# Patient Record
Sex: Male | Born: 1980 | Race: Black or African American | Hispanic: No | Marital: Married | Smoking: Current every day smoker
Health system: Southern US, Community
[De-identification: ages and names within clinical notes are randomized; demographics above are authoritative.]

## PROBLEM LIST (undated history)

## (undated) DIAGNOSIS — E785 Hyperlipidemia, unspecified: Secondary | ICD-10-CM

## (undated) DIAGNOSIS — I10 Essential (primary) hypertension: Secondary | ICD-10-CM

## (undated) HISTORY — DX: Hyperlipidemia, unspecified: E78.5

## (undated) HISTORY — DX: Essential (primary) hypertension: I10

## (undated) HISTORY — PX: FINGER NAIL SURGERY: SHX717

---

## 2018-01-04 ENCOUNTER — Encounter (INDEPENDENT_AMBULATORY_CARE_PROVIDER_SITE_OTHER): Payer: Self-pay | Admitting: Podiatry

## 2018-01-04 ENCOUNTER — Ambulatory Visit: Payer: Self-pay

## 2018-01-04 DIAGNOSIS — M7751 Other enthesopathy of right foot: Secondary | ICD-10-CM

## 2018-01-04 DIAGNOSIS — M778 Other enthesopathies, not elsewhere classified: Secondary | ICD-10-CM

## 2018-01-04 DIAGNOSIS — M779 Enthesopathy, unspecified: Principal | ICD-10-CM

## 2018-01-04 NOTE — Progress Notes (Signed)
This encounter was created in error - please disregard.

## 2020-04-16 ENCOUNTER — Ambulatory Visit (INDEPENDENT_AMBULATORY_CARE_PROVIDER_SITE_OTHER): Payer: Self-pay | Admitting: Urology

## 2020-04-16 ENCOUNTER — Encounter: Payer: Self-pay | Admitting: Urology

## 2020-04-16 ENCOUNTER — Other Ambulatory Visit: Payer: Self-pay

## 2020-04-16 VITALS — BP 145/98 | HR 89 | Ht 67.0 in | Wt 264.0 lb

## 2020-04-16 DIAGNOSIS — Z202 Contact with and (suspected) exposure to infections with a predominantly sexual mode of transmission: Secondary | ICD-10-CM

## 2020-04-16 DIAGNOSIS — R3912 Poor urinary stream: Secondary | ICD-10-CM

## 2020-04-16 DIAGNOSIS — R3121 Asymptomatic microscopic hematuria: Secondary | ICD-10-CM

## 2020-04-16 DIAGNOSIS — R300 Dysuria: Secondary | ICD-10-CM

## 2020-04-16 DIAGNOSIS — N529 Male erectile dysfunction, unspecified: Secondary | ICD-10-CM

## 2020-04-16 LAB — MICROSCOPIC EXAMINATION: Bacteria, UA: NONE SEEN

## 2020-04-16 LAB — URINALYSIS, COMPLETE
Bilirubin, UA: NEGATIVE
Glucose, UA: NEGATIVE
Ketones, UA: NEGATIVE
Leukocytes,UA: NEGATIVE
Nitrite, UA: NEGATIVE
Specific Gravity, UA: 1.025 (ref 1.005–1.030)
Urobilinogen, Ur: 0.2 mg/dL (ref 0.2–1.0)
pH, UA: 5.5 (ref 5.0–7.5)

## 2020-04-16 LAB — BLADDER SCAN AMB NON-IMAGING

## 2020-04-16 MED ORDER — NYSTATIN-TRIAMCINOLONE 100000-0.1 UNIT/GM-% EX OINT: 1.0000 "application " | TOPICAL_OINTMENT | Freq: Two times a day (BID) | CUTANEOUS | 0 refills | Status: DC

## 2020-04-16 MED ORDER — TADALAFIL 5 MG PO TABS: 5.0000 mg | ORAL_TABLET | Freq: Every day | ORAL | 11 refills | Status: DC | PRN

## 2020-04-16 NOTE — Progress Notes (Signed)
04/16/20 2:01 PM   William Cabrera 09-27-1981 [id number]  CC: ED, urinary symptoms, history of STD  HPI: I saw William Cabrera in urology clinic today for the above issues.  He is a 39 year old African-American male with morbid obesity and BMI of 41, no family history of prostate cancer, and history of recent trichomonas STD who presents with erectile dysfunction, urinary frequency and occasional dysuria.  He reports he completed antibiotics for trichomonas last week.  He only is sexually active with one partner at this time, but reports he has had multiple partners in the past and has never used protection reliably, he is never been tested for other STDs.  His primary complaint is occasional dysuria and urinary frequency, and worsening erectile dysfunction.  Shim score today is 11, indicating moderate ED.  He has never tried any medications for this before.  He denies any history of UTI or urinary retention.  He denies any family history of prostate cancer.  He also reports some itching and irritation at the base of the penis over the last few weeks that has been bothersome.  He was noted to have microscopic hematuria on urinalysis today with 3-10 RBCs, urinalysis otherwise benign.  PMH: Past Medical History:  Diagnosis Date  . Hyperlipemia     Surgical History: Past Surgical History:  Procedure Laterality Date  . FINGER NAIL SURGERY     child    Family History: Family History  Problem Relation Age of Onset  . Colon cancer Maternal Grandfather   . Bladder Cancer Neg Hx   . Kidney cancer Neg Hx   . Prostate cancer Neg Hx     Social History:  reports that he has been smoking cigarettes. He has been smoking about 1.00 pack per day. He has quit using smokeless tobacco. He reports current alcohol use. No history on file for drug.  Physical Exam: BP (!) 145/98   Pulse 89   Ht 5\' 7"  (1.702 m)   Wt 264 lb (119.7 kg)   BMI 41.35 kg/m    Constitutional:  Alert and oriented, No acute  distress. Cardiovascular: No clubbing, cyanosis, or edema. Respiratory: Normal respiratory effort, no increased work of breathing. GI: Abdomen is soft, nontender, nondistended, no abdominal masses GU: Circumcised phallus, small but patent meatus, testicles 20 cc and descended bilaterally without masses.  Subtle induration of the skin at the ventral aspect of the base consistent with a possible fungal infection  Laboratory Data: Reviewed, see HPI  Pertinent Imaging: None to review  Assessment & Plan:   In summary, is a 39 year old male with recent trichomonas and mild persistent dysuria, subtle penoscrotal rash, erectile dysfunction, and microscopic hematuria on urinalysis today.  We had a long discussion about his history of STD, urinary symptoms, and ED today.  We discussed that AUA guidelines regarding both microscopic hematuria and work-up of erectile dysfunction.  We discussed weight loss and smoking cessation as first steps for his erectile dysfunction.  I also recommended testing for gonorrhea and chlamydia today with his history of persistent mild dysuria, microscopic hematuria, and history of extensive sexual partners without protection.  We also reviewed his low-grade microscopic hematuria today and the new AUA guidelines regarding risk categories of low, intermediate, and high risk.  He falls into the low risk category.  Additionally, he recently underwent treatment for an STD and this microscopic hematuria could be residual inflammation from this.  Finally, we reviewed Cialis as a medication for ED and the risks and benefits at  length.  We also discussed the role of testosterone and ED evaluation per the AUA guidelines.  -Weight loss and smoking cessation encouraged -Trial of Cialis for ED -STD testing today for gonorrhea and chlamydia, call with results -Trial of Mycolog cream for likely fungal rash at penoscrotal junction -RTC 1 month with repeat urinalysis to confirm clearance of  microscopic hematuria, consider testosterone at that visit if persistent ED symptoms despite Cialis  I spent 60 total minutes on the day of the encounter including pre-visit review of the medical record, face-to-face time with the patient, and post visit ordering of labs/imaging/tests.  Legrand Rams, MD 04/16/2020  [facility] [address] [place], [place] [address] [phone]

## 2020-04-16 NOTE — Patient Instructions (Signed)
Tadalafil tablets (Cialis) What is this medicine? TADALAFIL (tah DA la fil) is used to treat erection problems in men. It is also used for enlargement of the prostate gland in men, a condition called benign prostatic hyperplasia or BPH. This medicine improves urine flow and reduces BPH symptoms. This medicine can also treat both erection problems and BPH when they occur together. This medicine may be used for other purposes; ask your health care provider or pharmacist if you have questions. COMMON BRAND NAME(S): Adcirca, ALYQ, Cialis What should I tell my health care provider before I take this medicine? They need to know if you have any of these conditions:  bleeding disorders  eye or vision problems, including a rare inherited eye disease called retinitis pigmentosa  anatomical deformation of the penis, Peyronie's disease, or history of priapism (painful and prolonged erection)  heart disease, angina, a history of heart attack, irregular heart beats, or other heart problems  high or low blood pressure  history of blood diseases, like sickle cell anemia or leukemia  history of stomach bleeding  kidney disease  liver disease  stroke  an unusual or allergic reaction to tadalafil, other medicines, foods, dyes, or preservatives  pregnant or trying to get pregnant  breast-feeding How should I use this medicine? Take this medicine by mouth with a Arko of water. Follow the directions on the prescription label. You may take this medicine with or without meals. When this medicine is used for erection problems, your doctor may prescribe it to be taken once daily or as needed. If you are taking the medicine as needed, you may be able to have sexual activity 30 minutes after taking it and for up to 36 hours after taking it. Whether you are taking the medicine as needed or once daily, you should not take more than one dose per day. If you are taking this medicine for symptoms of benign  prostatic hyperplasia (BPH) or to treat both BPH and an erection problem, take the dose once daily at about the same time each day. Do not take your medicine more often than directed. Talk to your pediatrician regarding the use of this medicine in children. Special care may be needed. Overdosage: If you think you have taken too much of this medicine contact a poison control center or emergency room at once. NOTE: This medicine is only for you. Do not share this medicine with others. What if I miss a dose? If you are taking this medicine as needed for erection problems, this does not apply. If you miss a dose while taking this medicine once daily for an erection problem, benign prostatic hyperplasia, or both, take it as soon as you remember, but do not take more than one dose per day. What may interact with this medicine? Do not take this medicine with any of the following medications:  nitrates like amyl nitrite, isosorbide dinitrate, isosorbide mononitrate, nitroglycerin  other medicines for erectile dysfunction like avanafil, sildenafil, vardenafil  other tadalafil products (Adcirca)  riociguat This medicine may also interact with the following medications:  certain drugs for high blood pressure  certain drugs for the treatment of HIV infection or AIDS  certain drugs used for fungal or yeast infections, like fluconazole, itraconazole, ketoconazole, and voriconazole  certain drugs used for seizures like carbamazepine, phenytoin, and phenobarbital  grapefruit juice  macrolide antibiotics like clarithromycin, erythromycin, troleandomycin  medicines for prostate problems  rifabutin, rifampin or rifapentine This list may not describe all possible interactions. Give your   health care provider a list of all the medicines, herbs, non-prescription drugs, or dietary supplements you use. Also tell them if you smoke, drink alcohol, or use illegal drugs. Some items may interact with your  medicine. What should I watch for while using this medicine? If you notice any changes in your vision while taking this drug, call your doctor or health care professional as soon as possible. Stop using this medicine and call your health care provider right away if you have a loss of sight in one or both eyes. Contact your doctor or health care professional right away if the erection lasts longer than 4 hours or if it becomes painful. This may be a sign of serious problem and must be treated right away to prevent permanent damage. If you experience symptoms of nausea, dizziness, chest pain or arm pain upon initiation of sexual activity after taking this medicine, you should refrain from further activity and call your doctor or health care professional as soon as possible. Do not drink alcohol to excess (examples, 5 glasses of wine or 5 shots of whiskey) when taking this medicine. When taken in excess, alcohol can increase your chances of getting a headache or getting dizzy, increasing your heart rate or lowering your blood pressure. Using this medicine does not protect you or your partner against HIV infection (the virus that causes AIDS) or other sexually transmitted diseases. What side effects may I notice from receiving this medicine? Side effects that you should report to your doctor or health care professional as soon as possible:  allergic reactions like skin rash, itching or hives, swelling of the face, lips, or tongue  breathing problems  changes in hearing  changes in vision  chest pain  fast, irregular heartbeat  prolonged or painful erection  seizures Side effects that usually do not require medical attention (report to your doctor or health care professional if they continue or are bothersome):  back pain  dizziness  flushing  headache  indigestion  muscle aches  nausea  stuffy or runny nose This list may not describe all possible side effects. Call your doctor  for medical advice about side effects. You may report side effects to FDA at 1-800-FDA-1088. Where should I keep my medicine? Keep out of the reach of children. Store at room temperature between 15 and 30 degrees C (59 and 86 degrees F). Throw away any unused medicine after the expiration date. NOTE: This sheet is a summary. It may not cover all possible information. If you have questions about this medicine, talk to your doctor, pharmacist, or health care provider.  2020 Elsevier/Gold Standard (2014-04-14 13:15:49) Erectile Dysfunction Erectile dysfunction (ED) is the inability to get or keep an erection in order to have sexual intercourse. Erectile dysfunction may include:  Inability to get an erection.  Lack of enough hardness of the erection to allow penetration.  Loss of the erection before sex is finished. What are the causes? This condition may be caused by:  Certain medicines, such as: ? Pain relievers. ? Antihistamines. ? Antidepressants. ? Blood pressure medicines. ? Water pills (diuretics). ? Ulcer medicines. ? Muscle relaxants. ? Drugs.  Excessive drinking.  Psychological causes, such as: ? Anxiety. ? Depression. ? Sadness. ? Exhaustion. ? Performance fear. ? Stress.  Physical causes, such as: ? Artery problems. This may include diabetes, smoking, liver disease, or atherosclerosis. ? High blood pressure. ? Hormonal problems, such as low testosterone. ? Obesity. ? Nerve problems. This may include back or pelvic   injuries, diabetes mellitus, multiple sclerosis, or Parkinson disease. What are the signs or symptoms? Symptoms of this condition include:  Inability to get an erection.  Lack of enough hardness of the erection to allow penetration.  Loss of the erection before sex is finished.  Normal erections at some times, but with frequent unsatisfactory episodes.  Low sexual satisfaction in either partner due to erection problems.  A curved penis  occurring with erection. The curve may cause pain or the penis may be too curved to allow for intercourse.  Never having nighttime erections. How is this diagnosed? This condition is often diagnosed by:  Performing a physical exam to find other diseases or specific problems with the penis.  Asking you detailed questions about the problem.  Performing blood tests to check for diabetes mellitus or to measure hormone levels.  Performing other tests to check for underlying health conditions.  Performing an ultrasound exam to check for scarring.  Performing a test to check blood flow to the penis.  Doing a sleep study at home to measure nighttime erections. How is this treated? This condition may be treated by:  Medicine taken by mouth to help you achieve an erection (oral medicine).  Hormone replacement therapy to replace low testosterone levels.  Medicine that is injected into the penis. Your health care provider may instruct you how to give yourself these injections at home.  Vacuum pump. This is a pump with a ring on it. The pump and ring are placed on the penis and used to create pressure that helps the penis become erect.  Penile implant surgery. In this procedure, you may receive: ? An inflatable implant. This consists of cylinders, a pump, and a reservoir. The cylinders can be inflated with a fluid that helps to create an erection, and they can be deflated after intercourse. ? A semi-rigid implant. This consists of two silicone rubber rods. The rods provide some rigidity. They are also flexible, so the penis can both curve downward in its normal position and become straight for sexual intercourse.  Blood vessel surgery, to improve blood flow to the penis. During this procedure, a blood vessel from a different part of the body is placed into the penis to allow blood to flow around (bypass) damaged or blocked blood vessels.  Lifestyle changes, such as exercising more, losing  weight, and quitting smoking. Follow these instructions at home: Medicines   Take over-the-counter and prescription medicines only as told by your health care provider. Do not increase the dosage without first discussing it with your health care provider.  If you are using self-injections, perform injections as directed by your health care provider. Make sure to avoid any veins that are on the surface of the penis. After giving an injection, apply pressure to the injection site for 5 minutes. General instructions  Exercise regularly, as directed by your health care provider. Work with your health care provider to lose weight, if needed.  Do not use any products that contain nicotine or tobacco, such as cigarettes and e-cigarettes. If you need help quitting, ask your health care provider.  Before using a vacuum pump, read the instructions that come with the pump and discuss any questions with your health care provider.  Keep all follow-up visits as told by your health care provider. This is important. Contact a health care provider if:  You feel nauseous.  You vomit. Get help right away if:  You are taking oral or injectable medicines and you have an   erection that lasts longer than 4 hours. If your health care provider is unavailable, go to the nearest emergency room for evaluation. An erection that lasts much longer than 4 hours can result in permanent damage to your penis.  You have severe pain in your groin or abdomen.  You develop redness or severe swelling of your penis.  You have redness spreading up into your groin or lower abdomen.  You are unable to urinate.  You experience chest pain or a rapid heart beat (palpitations) after taking oral medicines. Summary  Erectile dysfunction (ED) is the inability to get or keep an erection during sexual intercourse. This problem can usually be treated successfully.  This condition is diagnosed based on a physical exam, your  symptoms, and tests to determine the cause. Treatment varies depending on the cause, and may include medicines, hormone therapy, surgery, or vacuum pump.  You may need follow-up visits to make sure that you are using your medicines or devices correctly.  Get help right away if you are taking or injecting medicines and you have an erection that lasts longer than 4 hours. This information is not intended to replace advice given to you by your health care provider. Make sure you discuss any questions you have with your health care provider. Document Revised: 11/06/2017 Document Reviewed: 12/10/2016 Elsevier Patient Education  2020 Elsevier Inc.  

## 2020-04-18 ENCOUNTER — Telehealth: Payer: Self-pay

## 2020-04-18 LAB — GC/CHLAMYDIA PROBE AMP
Chlamydia trachomatis, NAA: NEGATIVE
Neisseria Gonorrhoeae by PCR: NEGATIVE

## 2020-04-18 NOTE — Telephone Encounter (Signed)
Called pt no answer. Unable to leave message as no voicemail is set up. 1st attempt.  

## 2020-04-18 NOTE — Telephone Encounter (Signed)
-----   Message from Sondra Come, MD sent at 04/18/2020 11:48 AM EDT ----- STD testing negative, follow up as schedule in June  Legrand Rams, MD 04/18/2020

## 2020-04-23 NOTE — Telephone Encounter (Signed)
Called pt no answer. Unable to leave message as no voicemail is set up. 2nd attempt.  

## 2020-04-24 NOTE — Telephone Encounter (Signed)
Pt informed

## 2020-05-16 ENCOUNTER — Ambulatory Visit: Payer: Self-pay | Admitting: Urology

## 2020-05-23 ENCOUNTER — Encounter: Payer: Self-pay | Admitting: Urology

## 2020-05-23 ENCOUNTER — Ambulatory Visit (INDEPENDENT_AMBULATORY_CARE_PROVIDER_SITE_OTHER): Payer: Self-pay | Admitting: Urology

## 2020-05-23 ENCOUNTER — Other Ambulatory Visit: Payer: Self-pay

## 2020-05-23 VITALS — BP 145/87 | HR 91 | Ht 67.0 in | Wt 260.0 lb

## 2020-05-23 DIAGNOSIS — R3121 Asymptomatic microscopic hematuria: Secondary | ICD-10-CM

## 2020-05-23 DIAGNOSIS — R300 Dysuria: Secondary | ICD-10-CM

## 2020-05-23 DIAGNOSIS — N529 Male erectile dysfunction, unspecified: Secondary | ICD-10-CM

## 2020-05-23 NOTE — Progress Notes (Signed)
   05/23/2020 9:59 AM   William Cabrera Oct 23, 1981 [id number]  Reason for visit: Follow up ED, urinary frequency, penile rash, microscopic hematuria  HPI: I saw William Cabrera back in urology clinic for follow-up of the above issues.  He is a 39 year old African-American male with morbid obesity and a BMI of 41 and current smoker who I originally saw on 5/10 for the above issues.  We tried Cialis 5 mg every other day for his ED, Mycolog cream for his penile rash, and he elected for repeat urinalysis for his microscopic hematuria prior to pursuing a work-up.  He also has urinary frequency likely secondary to coffee and soda intake, as well as his obesity.  He reports he did not notice a significant difference in erections with the 5 mg Cialis every other day.  He does admit to being less sexually active over the last month so he is unsure if that contributed.  His penile rash completely resolved with the Mycolog cream.  He still reports bothersome urinary frequency in the morning when drinking coffee, as well as urination 1-2 times overnight.  Urinalysis today shows persistent microscopic hematuria with 3-10 RBCs.  I had a very long conversation with the patient about all of his symptoms.  We first reviewed behavioral strategies of weight loss, exercise, and smoking cessation which likely will improve all of his urologic issues.  I recommended an increase in Cialis to 10 mg from the 5 mg, as well as checking a testosterone today per the AUA guidelines.  Finally, regarding his urinary frequency we discussed behavioral strategies at length including avoiding coffee, soda, diet drinks, minimizing fluids before bed, and timed voiding, especially in the evening.    We discussed common possible etiologies of microscopic hematuria including idiopathic, urolithiasis, medical renal disease, and malignancy. We discussed the new asymptomatic microscopic hematuria guidelines and risk categories of low, intermediate, and  high risk that are based on age, risk factors like smoking, and degree of microscopic hematuria. We discussed work-up can range from repeat urinalysis, renal ultrasound and cystoscopy, to CT urogram and cystoscopy.  They fall into the low risk category, and I recommended proceeding with cystoscopy and renal ultrasound.   Testosterone today, call with results Increase Cialis to 10 mg every other day Renal ultrasound, cystoscopy to complete microscopic hematuria work-up   I spent 40 total minutes on the day of the encounter including pre-visit review of the medical record, face-to-face time with the patient, and post visit ordering of labs/imaging/tests.  Sondra Come, MD  [facility] [address] [place], [place] [address] [phone]

## 2020-05-23 NOTE — Patient Instructions (Signed)
Cystoscopy Cystoscopy is a procedure that is used to help diagnose and sometimes treat conditions that affect the lower urinary tract. The lower urinary tract includes the bladder and the urethra. The urethra is the tube that drains urine from the bladder. Cystoscopy is done using a thin, tube-shaped instrument with a light and camera at the end (cystoscope). The cystoscope may be hard or flexible, depending on the goal of the procedure. The cystoscope is inserted through the urethra, into the bladder. Cystoscopy may be recommended if you have:  Urinary tract infections that keep coming back.  Blood in the urine (hematuria).  An inability to control when you urinate (urinary incontinence) or an overactive bladder.  Unusual cells found in a urine sample.  A blockage in the urethra, such as a urinary stone.  Painful urination.  An abnormality in the bladder found during an intravenous pyelogram (IVP) or CT scan. Cystoscopy may also be done to remove a sample of tissue to be examined under a microscope (biopsy). What are the risks? Generally, this is a safe procedure. However, problems may occur, including:  Infection.  Bleeding.  What happens during the procedure?  1. You will be given one or more of the following: ? A medicine to numb the area (local anesthetic). 2. The area around the opening of your urethra will be cleaned. 3. The cystoscope will be passed through your urethra into your bladder. 4. Germ-free (sterile) fluid will flow through the cystoscope to fill your bladder. The fluid will stretch your bladder so that your health care provider can clearly examine your bladder walls. 5. Your doctor will look at the urethra and bladder. 6. The cystoscope will be removed The procedure may vary among health care providers  What can I expect after the procedure? After the procedure, it is common to have: 1. Some soreness or pain in your abdomen and urethra. 2. Urinary symptoms.  These include: ? Mild pain or burning when you urinate. Pain should stop within a few minutes after you urinate. This may last for up to 1 week. ? A small amount of blood in your urine for several days. ? Feeling like you need to urinate but producing only a small amount of urine. Follow these instructions at home: General instructions  Return to your normal activities as told by your health care provider.   Do not drive for 24 hours if you were given a sedative during your procedure.  Watch for any blood in your urine. If the amount of blood in your urine increases, call your health care provider.  If a tissue sample was removed for testing (biopsy) during your procedure, it is up to you to get your test results. Ask your health care provider, or the department that is doing the test, when your results will be ready.  Drink enough fluid to keep your urine pale yellow.  Keep all follow-up visits as told by your health care provider. This is important. Contact a health care provider if you:  Have pain that gets worse or does not get better with medicine, especially pain when you urinate.  Have trouble urinating.  Have more blood in your urine. Get help right away if you:  Have blood clots in your urine.  Have abdominal pain.  Have a fever or chills.  Are unable to urinate. Summary  Cystoscopy is a procedure that is used to help diagnose and sometimes treat conditions that affect the lower urinary tract.  Cystoscopy is done using   a thin, tube-shaped instrument with a light and camera at the end.  After the procedure, it is common to have some soreness or pain in your abdomen and urethra.  Watch for any blood in your urine. If the amount of blood in your urine increases, call your health care provider.  If you were prescribed an antibiotic medicine, take it as told by your health care provider. Do not stop taking the antibiotic even if you start to feel better. This  information is not intended to replace advice given to you by your health care provider. Make sure you discuss any questions you have with your health care provider. Document Revised: 11/16/2018 Document Reviewed: 11/16/2018 Elsevier Patient Education  2020 Elsevier Inc.   

## 2020-05-24 LAB — TESTOSTERONE: Testosterone: 521 ng/dL (ref 264–916)

## 2020-05-25 ENCOUNTER — Telehealth: Payer: Self-pay

## 2020-05-25 NOTE — Telephone Encounter (Signed)
Notified on vmail 

## 2020-05-25 NOTE — Telephone Encounter (Signed)
-----   Message from Sondra Come, MD sent at 05/24/2020 12:18 PM EDT ----- Testosterone normal, follow up as scheduled  Legrand Rams, MD 05/24/2020

## 2020-05-28 LAB — MICROSCOPIC EXAMINATION: Bacteria, UA: NONE SEEN

## 2020-05-28 LAB — URINALYSIS, COMPLETE
Bilirubin, UA: NEGATIVE
Glucose, UA: NEGATIVE
Ketones, UA: NEGATIVE
Leukocytes,UA: NEGATIVE
Nitrite, UA: NEGATIVE
Protein,UA: NEGATIVE
Specific Gravity, UA: 1.025 (ref 1.005–1.030)
Urobilinogen, Ur: 1 mg/dL (ref 0.2–1.0)
pH, UA: 5 (ref 5.0–7.5)

## 2020-06-20 ENCOUNTER — Ambulatory Visit (INDEPENDENT_AMBULATORY_CARE_PROVIDER_SITE_OTHER): Payer: Medicaid Other | Admitting: Urology

## 2020-06-20 ENCOUNTER — Other Ambulatory Visit: Payer: Self-pay

## 2020-06-20 ENCOUNTER — Encounter: Payer: Self-pay | Admitting: Urology

## 2020-06-20 VITALS — BP 146/83 | HR 105 | Ht 66.0 in | Wt 246.0 lb

## 2020-06-20 DIAGNOSIS — R300 Dysuria: Secondary | ICD-10-CM

## 2020-06-20 DIAGNOSIS — N529 Male erectile dysfunction, unspecified: Secondary | ICD-10-CM

## 2020-06-20 DIAGNOSIS — R3121 Asymptomatic microscopic hematuria: Secondary | ICD-10-CM

## 2020-06-20 NOTE — Patient Instructions (Signed)
Call [phone] to schedule Renal Ultrasound

## 2020-06-20 NOTE — Progress Notes (Signed)
Cystoscopy Procedure Note:  Indication: microscopic hematuria  After informed consent and discussion of the procedure and its risks, Dale Ribeiro was positioned and prepped in the standard fashion. Cystoscopy was performed with a flexible cystoscope. The urethra, bladder neck and entire bladder was visualized in a standard fashion. The prostate was short with a high bladder neck. The ureteral orifices were visualized in their normal location and orientation. No abnormalities on retroflexion, mucosa normal throughout.  Imaging: Renal US not yet completed, will call with results  Findings: Normal cysto  Assessment and Plan: RTC 6 months to discuss ED, urinary symptoms  Legrand Rams, MD 06/20/2020

## 2020-07-09 ENCOUNTER — Ambulatory Visit
Admission: RE | Admit: 2020-07-09 | Discharge: 2020-07-09 | Disposition: A | Discharge status: Home / Self Care | Payer: Self-pay | Source: Ambulatory Visit | Attending: Urology | Admitting: Urology

## 2020-07-09 ENCOUNTER — Other Ambulatory Visit: Payer: Self-pay

## 2020-07-09 DIAGNOSIS — R3121 Asymptomatic microscopic hematuria: Secondary | ICD-10-CM | POA: Insufficient documentation

## 2020-07-10 ENCOUNTER — Telehealth: Payer: Self-pay

## 2020-07-10 NOTE — Telephone Encounter (Signed)
-----   Message from Sondra Come, MD sent at 07/10/2020  8:02 AM EDT ----- Renal US normal, follow up as scheduled in ~ 6 months, sooner if any problems  Legrand Rams, MD 07/10/2020

## 2020-07-10 NOTE — Telephone Encounter (Signed)
Called pt no answer. Left detailed message for pt informing him of the information below. Advised pt to call back for questions or concerns.  

## 2020-12-19 ENCOUNTER — Ambulatory Visit (INDEPENDENT_AMBULATORY_CARE_PROVIDER_SITE_OTHER): Payer: Self-pay | Admitting: Urology

## 2020-12-19 ENCOUNTER — Encounter: Payer: Self-pay | Admitting: Urology

## 2020-12-19 ENCOUNTER — Ambulatory Visit: Payer: Self-pay | Admitting: Urology

## 2020-12-19 ENCOUNTER — Other Ambulatory Visit: Payer: Self-pay

## 2020-12-19 VITALS — BP 139/86 | HR 84 | Ht 67.0 in | Wt 256.0 lb

## 2020-12-19 DIAGNOSIS — R3121 Asymptomatic microscopic hematuria: Secondary | ICD-10-CM

## 2020-12-19 DIAGNOSIS — N529 Male erectile dysfunction, unspecified: Secondary | ICD-10-CM

## 2020-12-19 DIAGNOSIS — N3281 Overactive bladder: Secondary | ICD-10-CM

## 2020-12-19 MED ORDER — TADALAFIL 10 MG PO TABS: 10.0000 mg | ORAL_TABLET | Freq: Every day | ORAL | 6 refills | Status: DC | PRN

## 2020-12-19 NOTE — Patient Instructions (Signed)
Erectile Dysfunction Erectile dysfunction (ED) is the inability to get or keep an erection in order to have sexual intercourse. ED is considered a symptom of an underlying disorder and not considered a disease. Erectile dysfunction may include:  Inability to get an erection.  Lack of enough hardness of the erection to allow penetration.  Loss of the erection before sex is finished. What are the causes? This condition may be caused by:  Certain medicines, such as: ? Pain relievers. ? Antihistamines. ? Antidepressants. ? Blood pressure medicines. ? Water pills (diuretics). ? Ulcer medicines. ? Muscle relaxants. ? Drugs.  Excessive drinking.  Psychological causes, such as: ? Anxiety. ? Depression. ? Sadness. ? Exhaustion. ? Performance fear. ? Stress.  Physical causes, such as: ? Artery problems. This may include diabetes, smoking, liver disease, or atherosclerosis. ? High blood pressure. ? Hormonal problems, such as low testosterone. ? Obesity. ? Nerve problems. This may include back or pelvic injuries, diabetes mellitus, multiple sclerosis, or Parkinson's disease. What are the signs or symptoms? Symptoms of this condition include:  Inability to get an erection.  Lack of enough hardness of the erection to allow penetration.  Loss of the erection before sex is finished.  Normal erections at some times, but with frequent unsatisfactory episodes.  Low sexual satisfaction in either partner due to erection problems.  A curved penis occurring with erection. The curve may cause pain or the penis may be too curved to allow for intercourse.  Never having nighttime erections. How is this diagnosed? This condition is often diagnosed by:  Performing a physical exam to find other diseases or specific problems with the penis.  Asking you detailed questions about the problem.  Performing blood tests to check for diabetes mellitus or to measure hormone levels.  Performing  other tests to check for underlying health conditions.  Performing an ultrasound exam to check for scarring.  Performing a test to check blood flow to the penis.  Doing a sleep study at home to measure nighttime erections. How is this treated? This condition may be treated by:  Medicine taken by mouth to help you achieve an erection (oral medicine).  Hormone replacement therapy to replace low testosterone levels.  Medicine that is injected into the penis. Your health care provider may instruct you how to give yourself these injections at home.  Vacuum pump. This is a pump with a ring on it. The pump and ring are placed on the penis and used to create pressure that helps the penis become erect.  Penile implant surgery. In this procedure, you may receive: ? An inflatable implant. This consists of cylinders, a pump, and a reservoir. The cylinders can be inflated with a fluid that helps to create an erection, and they can be deflated after intercourse. ? A semi-rigid implant. This consists of two silicone rubber rods. The rods provide some rigidity. They are also flexible, so the penis can both curve downward in its normal position and become straight for sexual intercourse.  Blood vessel surgery, to improve blood flow to the penis. During this procedure, a blood vessel from a different part of the body is placed into the penis to allow blood to flow around (bypass) damaged or blocked blood vessels.  Lifestyle changes, such as exercising more, losing weight, and quitting smoking. Follow these instructions at home: Medicines  Take over-the-counter and prescription medicines only as told by your health care provider. Do not increase the dosage without first discussing it with your health care   provider.  If you are using self-injections, perform injections as directed by your health care provider. Make sure to avoid any veins that are on the surface of the penis. After giving an injection,  apply pressure to the injection site for 5 minutes.   General instructions  Exercise regularly, as directed by your health care provider. Work with your health care provider to lose weight, if needed.  Do not use any products that contain nicotine or tobacco, such as cigarettes and e-cigarettes. If you need help quitting, ask your health care provider.  Before using a vacuum pump, read the instructions that come with the pump and discuss any questions with your health care provider.  Keep all follow-up visits as told by your health care provider. This is important. Contact a health care provider if:  You feel nauseous.  You vomit. Get help right away if:  You are taking oral or injectable medicines and you have an erection that lasts longer than 4 hours. If your health care provider is unavailable, go to the nearest emergency room for evaluation. An erection that lasts much longer than 4 hours can result in permanent damage to your penis.  You have severe pain in your groin or abdomen.  You develop redness or severe swelling of your penis.  You have redness spreading up into your groin or lower abdomen.  You are unable to urinate.  You experience chest pain or a rapid heart beat (palpitations) after taking oral medicines. Summary  Erectile dysfunction (ED) is the inability to get or keep an erection during sexual intercourse. This problem can usually be treated successfully.  This condition is diagnosed based on a physical exam, your symptoms, and tests to determine the cause. Treatment varies depending on the cause and may include medicines, hormone therapy, surgery, or a vacuum pump.  You may need follow-up visits to make sure that you are using your medicines or devices correctly.  Get help right away if you are taking or injecting medicines and you have an erection that lasts longer than 4 hours. This information is not intended to replace advice given to you by your health  care provider. Make sure you discuss any questions you have with your health care provider. Document Revised: 08/10/2020 Document Reviewed: 08/10/2020 Elsevier Patient Education  2021 Elsevier Inc.  

## 2020-12-19 NOTE — Progress Notes (Signed)
   12/19/2020 3:49 PM   William Cabrera 1981/03/08 [id number]  Reason for visit: Follow up ED, urinary symptoms, history of microscopic hematuria  HPI: I saw [name] back in urology clinic for follow-up of the above issues.  He is a 40 year old male with morbid obesity and BMI of 40 who previously underwent a microscopic hematuria work-up with cystoscopy and renal ultrasound that were negative.  He also has ED that has not been particularly responsive to 5 to 10 mg Cialis on demand.  His testosterone was checked in July 2021 and was normal at 521.  He also previously had some urinary frequency, but this has resolved with behavioral strategies of minimizing coffee and soda in the diet.  He denies any urinary complaints today.  His primary complaint is persistent decrease in sex drive, and he reports he has not tried the Cialis in the last 5 to 6 months.  We primarily focused on behavioral strategies today including weight loss, exercise, and smoking cessation.  We also discussed that he can increase the Cialis to 20 mg on demand, new prescription was given.  He would like to follow-up as needed.   Sondra Come, MD  [facility] [address] [place], [place] [address] [phone]

## 2021-04-03 ENCOUNTER — Encounter (INDEPENDENT_AMBULATORY_CARE_PROVIDER_SITE_OTHER): Payer: Self-pay

## 2021-04-03 ENCOUNTER — Inpatient Hospital Stay: Payer: Self-pay | Attending: Oncology | Admitting: Oncology

## 2021-04-03 ENCOUNTER — Inpatient Hospital Stay: Payer: Medicaid Other

## 2021-04-03 ENCOUNTER — Encounter: Payer: Self-pay | Admitting: Oncology

## 2021-04-03 VITALS — BP 115/88 | HR 74 | Temp 97.9°F | Resp 18 | Ht 67.0 in | Wt 254.4 lb

## 2021-04-03 DIAGNOSIS — D72829 Elevated white blood cell count, unspecified: Secondary | ICD-10-CM

## 2021-04-03 DIAGNOSIS — Z803 Family history of malignant neoplasm of breast: Secondary | ICD-10-CM | POA: Insufficient documentation

## 2021-04-03 DIAGNOSIS — F17210 Nicotine dependence, cigarettes, uncomplicated: Secondary | ICD-10-CM | POA: Insufficient documentation

## 2021-04-03 DIAGNOSIS — Z800 Family history of malignant neoplasm of digestive organs: Secondary | ICD-10-CM | POA: Insufficient documentation

## 2021-04-03 DIAGNOSIS — J984 Other disorders of lung: Secondary | ICD-10-CM | POA: Insufficient documentation

## 2021-04-03 DIAGNOSIS — Z720 Tobacco use: Secondary | ICD-10-CM

## 2021-04-03 LAB — COMPREHENSIVE METABOLIC PANEL
ALT: 20 U/L (ref 0–44)
AST: 17 U/L (ref 15–41)
Albumin: 4.4 g/dL (ref 3.5–5.0)
Alkaline Phosphatase: 87 U/L (ref 38–126)
Anion gap: 11 (ref 5–15)
BUN: 17 mg/dL (ref 6–20)
CO2: 27 mmol/L (ref 22–32)
Calcium: 9.8 mg/dL (ref 8.9–10.3)
Chloride: 98 mmol/L (ref 98–111)
Creatinine, Ser: 1.3 mg/dL — ABNORMAL HIGH (ref 0.61–1.24)
GFR, Estimated: >60 mL/min (ref >60)
Glucose, Bld: 91 mg/dL (ref 70–99)
Potassium: 4.1 mmol/L (ref 3.5–5.1)
Sodium: 136 mmol/L (ref 135–145)
Total Bilirubin: 0.7 mg/dL (ref 0.3–1.2)
Total Protein: 8.6 g/dL — ABNORMAL HIGH (ref 6.5–8.1)

## 2021-04-03 LAB — CBC WITH DIFFERENTIAL/PLATELET
Abs Immature Granulocytes: 0.06 10*3/uL (ref 0.00–0.07)
Basophils Absolute: 0.1 10*3/uL (ref 0.0–0.1)
Basophils Relative: 1 %
Eosinophils Absolute: 0.5 10*3/uL (ref 0.0–0.5)
Eosinophils Relative: 3 %
HCT: 48.4 % (ref 39.0–52.0)
Hemoglobin: 16 g/dL (ref 13.0–17.0)
Immature Granulocytes: 0 %
Lymphocytes Relative: 28 %
Lymphs Abs: 4.9 10*3/uL — ABNORMAL HIGH (ref 0.7–4.0)
MCH: 28 pg (ref 26.0–34.0)
MCHC: 33.1 g/dL (ref 30.0–36.0)
MCV: 84.6 fL (ref 80.0–100.0)
Monocytes Absolute: 0.8 10*3/uL (ref 0.1–1.0)
Monocytes Relative: 4 %
Neutro Abs: 11 10*3/uL — ABNORMAL HIGH (ref 1.7–7.7)
Neutrophils Relative %: 64 %
Platelets: 287 10*3/uL (ref 150–400)
RBC: 5.72 MIL/uL (ref 4.22–5.81)
RDW: 13.3 % (ref 11.5–15.5)
WBC: 17.3 10*3/uL — ABNORMAL HIGH (ref 4.0–10.5)
nRBC: 0 % (ref 0.0–0.2)

## 2021-04-03 LAB — HEPATITIS PANEL, ACUTE
HCV Ab: NONREACTIVE
Hep A IgM: NONREACTIVE
Hep B C IgM: NONREACTIVE
Hepatitis B Surface Ag: NONREACTIVE

## 2021-04-03 LAB — LACTATE DEHYDROGENASE: LDH: 94 U/L — ABNORMAL LOW (ref 98–192)

## 2021-04-03 LAB — TECHNOLOGIST SMEAR REVIEW: Plt Morphology: ADEQUATE

## 2021-04-03 NOTE — Progress Notes (Signed)
Pt here to establish care for elevated WBC.

## 2021-04-03 NOTE — Progress Notes (Signed)
Hematology/Oncology Consult note [facility] [phone] [phone]   Patient Care Team: Sherrie Mustache, MD as PCP - General (Internal Medicine)  REFERRING PROVIDER: Sherrie Mustache, MD  CHIEF COMPLAINTS/REASON FOR VISIT:  Evaluation of leukocytosis  HISTORY OF PRESENTING ILLNESS:   William Cabrera is a  40 y.o.  male with PMH listed below was seen in consultation at the request of  Sherrie Mustache, MD  for evaluation of leukocytosis  Reviewed patient's blood work done recently at the primary care provider's office.  WBC 18.  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 Reports a history of right index finger infection for which he was put on 1 year of fluconazole. Denies any unintentional weight loss, fever, night sweat Mentioned that he had a boil on his butt which is infected intermittently.  Patient is a current everyday smoker.  Review of Systems  Constitutional: Negative for appetite change, chills, fatigue, fever and unexpected weight change.  HENT:   Negative for hearing loss and voice change.   Eyes: Negative for eye problems and icterus.  Respiratory: Negative for chest tightness, cough and shortness of breath.   Cardiovascular: Negative for chest pain and leg swelling.  Gastrointestinal: Negative for abdominal distention and abdominal pain.  Endocrine: Negative for hot flashes.  Genitourinary: Negative for difficulty urinating, dysuria and frequency.   Musculoskeletal: Negative for arthralgias.  Skin: Negative for itching and rash.  Neurological: Negative for light-headedness and numbness.  Hematological: Negative for adenopathy. Does not bruise/bleed easily.  Psychiatric/Behavioral: Negative for confusion.    MEDICAL HISTORY:  Past Medical History:  Diagnosis Date  . Hyperlipemia   . Hypertension     SURGICAL HISTORY: Past Surgical History:  Procedure Laterality Date  . FINGER NAIL  SURGERY     child    SOCIAL HISTORY: Social History   Socioeconomic History  . Marital status: Married    Spouse name: Not on file  . Number of children: Not on file  . Years of education: Not on file  . Highest education level: Not on file  Occupational History  . Not on file  Tobacco Use  . Smoking status: Current Every Day Smoker    Packs/day: 1.00    Years: 24.00    Pack years: 24.00    Types: Cigarettes  . Smokeless tobacco: Former Clinical biochemist  . Vaping Use: Never used  Substance and Sexual Activity  . Alcohol use: Not Currently  . Drug use: Not Currently    Types: Marijuana    Comment: Marijuana in the past   . Sexual activity: Yes    Birth control/protection: None  Other Topics Concern  . Not on file  Social History Narrative  . Not on file   Social Determinants of Health   Financial Resource Strain: Not on file  Food Insecurity: Not on file  Transportation Needs: Not on file  Physical Activity: Not on file  Stress: Not on file  Social Connections: Not on file  Intimate Partner Violence: Not on file    FAMILY HISTORY: Family History  Problem Relation Age of Onset  . Colon cancer Maternal Grandfather   . Multiple sclerosis Mother   . Hypertension Father   . High Cholesterol Father   . Breast cancer Maternal Aunt   . Bladder Cancer Neg Hx   . Kidney cancer Neg Hx   . Prostate cancer Neg Hx     ALLERGIES:  has No Known Allergies.  MEDICATIONS:  Current Outpatient Medications  Medication Sig Dispense Refill  . atorvastatin (LIPITOR) 10 MG tablet Take 10 mg by mouth daily.    Marland Kitchen LOSARTAN POTASSIUM PO Take by mouth daily at 2 PM.    . tadalafil (CIALIS) 10 MG tablet Take 1-2 tablets (10-20 mg total) by mouth daily as needed for erectile dysfunction. 30 tablet 6  . VITAMIN D PO Take by mouth daily at 2 PM.     No current facility-administered medications for this visit.     PHYSICAL EXAMINATION: ECOG PERFORMANCE STATUS: 0 -  Asymptomatic Vitals:   04/03/21 1510  BP: 115/88  Pulse: 74  Resp: 18  Temp: 97.9 F (36.6 C)   Filed Weights   04/03/21 1510  Weight: 254 lb 6.4 oz (115.4 kg)    Physical Exam  LABORATORY DATA:  I have reviewed the data as listed Lab Results  Component Value Date   WBC 17.3 (H) 04/03/2021   HGB 16.0 04/03/2021   HCT 48.4 04/03/2021   MCV 84.6 04/03/2021   PLT 287 04/03/2021   Recent Labs    04/03/21 1554  NA 136  K 4.1  CL 98  CO2 27  GLUCOSE 91  BUN 17  CREATININE 1.30*  CALCIUM 9.8  GFRNONAA >60  PROT 8.6*  ALBUMIN 4.4  AST 17  ALT 20  ALKPHOS 87  BILITOT 0.7   Iron/TIBC/Ferritin/ %Sat No results found for: IRON, TIBC, FERRITIN, IRONPCTSAT    RADIOGRAPHIC STUDIES: I have personally reviewed the radiological images as listed and agreed with the findings in the report. No results found.    ASSESSMENT & PLAN:  1. Leukocytosis, unspecified type   2. Tobacco use    Labs reviewed and discussed with patient I explained to patient that leukocytosis can be secondary to acute/chronic inflammation, infection, medication side effect, smoking, pulmonary disorders. Check CBC, CMP hepatitis panel, LDH, take smear.  Peripheral blood flow cytometry.  Orders Placed This Encounter  Procedures  . CBC with Differential/Platelet    Standing Status:   Future    Number of Occurrences:   1    Standing Expiration Date:   04/03/2022  . Comprehensive metabolic panel    Standing Status:   Future    Number of Occurrences:   1    Standing Expiration Date:   04/03/2022  . Technologist smear review    Standing Status:   Future    Number of Occurrences:   1    Standing Expiration Date:   04/03/2022  . Flow cytometry panel-leukemia/lymphoma work-up    Standing Status:   Future    Number of Occurrences:   1    Standing Expiration Date:   04/03/2022  . Lactate dehydrogenase    Standing Status:   Future    Number of Occurrences:   1    Standing Expiration Date:   04/03/2022   . Hepatitis panel, acute    Standing Status:   Future    Number of Occurrences:   1    Standing Expiration Date:   04/03/2022    All questions were answered. The patient knows to call the clinic with any problems questions or concerns.   Sherrie Mustache, MD    Return of visit: 2 weeks Thank you for this kind referral and the opportunity to participate in the care of this patient. A copy of today's note is routed to referring provider    Rickard Patience, MD, PhD Hematology Oncology [facility]  Regional Pager- [phone] 04/03/2021

## 2021-04-05 LAB — COMP PANEL: LEUKEMIA/LYMPHOMA

## 2021-04-12 ENCOUNTER — Telehealth: Payer: Self-pay | Admitting: Oncology

## 2021-04-12 NOTE — Telephone Encounter (Signed)
04/12/2021 Left VM for pt informing him that virtual MD visit on 5/16 has been moved to 5/19 per MD request. Gave call back number in case there were any questions or scheduling conflicts  SRW

## 2021-04-22 ENCOUNTER — Telehealth: Payer: Medicaid Other | Admitting: Oncology

## 2021-04-25 ENCOUNTER — Encounter: Payer: Self-pay | Admitting: Oncology

## 2021-04-25 ENCOUNTER — Other Ambulatory Visit: Payer: Self-pay

## 2021-04-25 ENCOUNTER — Inpatient Hospital Stay: Payer: Self-pay | Attending: Oncology | Admitting: Oncology

## 2021-04-25 DIAGNOSIS — Z720 Tobacco use: Secondary | ICD-10-CM

## 2021-04-25 DIAGNOSIS — D72829 Elevated white blood cell count, unspecified: Secondary | ICD-10-CM

## 2021-04-25 DIAGNOSIS — R778 Other specified abnormalities of plasma proteins: Secondary | ICD-10-CM

## 2021-04-25 NOTE — Progress Notes (Signed)
Pt contacted for Mychart visit. No new concerns voiced.  

## 2021-04-30 NOTE — Progress Notes (Signed)
HEMATOLOGY-ONCOLOGY TeleHEALTH VISIT PROGRESS NOTE  I connected with William Cabrera on 04/30/21  at  3:15 PM EDT by video enabled telemedicine visit and verified that I am speaking with the correct person using two identifiers. I discussed the limitations, risks, security and privacy concerns of performing an evaluation and management service by telemedicine and the availability of in-person appointments. The patient expressed understanding and agreed to proceed.   Other persons participating in the visit and their role in the encounter:  None  Patient's location: Home  Provider's location: office Chief Complaint: Follow-up for leukocytosis work-up.   INTERVAL HISTORY William Cabrera is a 40 y.o. male who has above history reviewed by me today presents for follow up visit for  Leukocytosis Patient had a blood work-up done since last visit and presents to discuss lab results and management plan. He has no new complaints. Review of Systems  Constitutional: Negative for appetite change, chills, fatigue, fever and unexpected weight change.  HENT:   Negative for hearing loss and voice change.   Eyes: Negative for eye problems and icterus.  Respiratory: Negative for chest tightness, cough and shortness of breath.   Cardiovascular: Negative for chest pain and leg swelling.  Gastrointestinal: Negative for abdominal distention and abdominal pain.  Endocrine: Negative for hot flashes.  Genitourinary: Negative for difficulty urinating, dysuria and frequency.   Musculoskeletal: Negative for arthralgias.  Skin: Negative for itching and rash.  Neurological: Negative for light-headedness and numbness.  Hematological: Negative for adenopathy. Does not bruise/bleed easily.  Psychiatric/Behavioral: Negative for confusion.    Past Medical History:  Diagnosis Date  . Hyperlipemia   . Hypertension    Past Surgical History:  Procedure Laterality Date  . FINGER NAIL SURGERY     child    Family History   Problem Relation Age of Onset  . Colon cancer Maternal Grandfather   . Multiple sclerosis Mother   . Hypertension Father   . High Cholesterol Father   . Breast cancer Maternal Aunt   . Bladder Cancer Neg Hx   . Kidney cancer Neg Hx   . Prostate cancer Neg Hx     Social History   Socioeconomic History  . Marital status: Married    Spouse name: Not on file  . Number of children: Not on file  . Years of education: Not on file  . Highest education level: Not on file  Occupational History  . Not on file  Tobacco Use  . Smoking status: Current Every Day Smoker    Packs/day: 1.00    Years: 24.00    Pack years: 24.00    Types: Cigarettes  . Smokeless tobacco: Former Clinical biochemist  . Vaping Use: Never used  Substance and Sexual Activity  . Alcohol use: Not Currently  . Drug use: Not Currently    Types: Marijuana    Comment: Marijuana in the past   . Sexual activity: Yes    Birth control/protection: None  Other Topics Concern  . Not on file  Social History Narrative  . Not on file   Social Determinants of Health   Financial Resource Strain: Not on file  Food Insecurity: Not on file  Transportation Needs: Not on file  Physical Activity: Not on file  Stress: Not on file  Social Connections: Not on file  Intimate Partner Violence: Not on file    Current Outpatient Medications on File Prior to Visit  Medication Sig Dispense Refill  . atorvastatin (LIPITOR) 10 MG tablet Take 10  mg by mouth daily.    Marland Kitchen LOSARTAN POTASSIUM PO Take by mouth daily at 2 PM.    . tadalafil (CIALIS) 10 MG tablet Take 1-2 tablets (10-20 mg total) by mouth daily as needed for erectile dysfunction. 30 tablet 6  . VITAMIN D PO Take by mouth daily at 2 PM.     No current facility-administered medications on file prior to visit.    No Known Allergies     Observations/Objective: Today's Vitals   04/25/21 1505  PainSc: 0-No pain   There is no height or weight on file to calculate BMI.   Physical Exam Neurological:     Mental Status: He is alert.     CBC    Component Value Date/Time   WBC 17.3 (H) 04/03/2021 1554   RBC 5.72 04/03/2021 1554   HGB 16.0 04/03/2021 1554   HCT 48.4 04/03/2021 1554   PLT 287 04/03/2021 1554   MCV 84.6 04/03/2021 1554   MCH 28.0 04/03/2021 1554   MCHC 33.1 04/03/2021 1554   RDW 13.3 04/03/2021 1554   LYMPHSABS 4.9 (H) 04/03/2021 1554   MONOABS 0.8 04/03/2021 1554   EOSABS 0.5 04/03/2021 1554   BASOSABS 0.1 04/03/2021 1554    CMP     Component Value Date/Time   NA 136 04/03/2021 1554   K 4.1 04/03/2021 1554   CL 98 04/03/2021 1554   CO2 27 04/03/2021 1554   GLUCOSE 91 04/03/2021 1554   BUN 17 04/03/2021 1554   CREATININE 1.30 (H) 04/03/2021 1554   CALCIUM 9.8 04/03/2021 1554   PROT 8.6 (H) 04/03/2021 1554   ALBUMIN 4.4 04/03/2021 1554   AST 17 04/03/2021 1554   ALT 20 04/03/2021 1554   ALKPHOS 87 04/03/2021 1554   BILITOT 0.7 04/03/2021 1554   GFRNONAA >60 04/03/2021 1554     Assessment and Plan: 1. Leukocytosis, unspecified type   2. Tobacco use   3. Elevated total protein     Leukocytosis, labs reviewed and discussed with patient. CBC showed white count of 17.3, predominantly neutrophilia and lymphocytosis and mild eosinophilia Negative hepatitis panel, LDH is not elevated, peripheral flow cytometry is negative for immunophenotypic abnormality.  The lymphocytosis is due to an increase of CD4+ T cells and a CD8+ T cells and B cells, increase of the multiple lymphocytes suggest a reactive process.  Eosinophilia, probably inflammatory, needs to be monitored. I discussed with patient that leukocytosis most likely secondary to smoking.  Smoking cessation was discussed with patient and he is motivated.  Elevated creatinine at 1.3, GFR is more than 60.  No baseline to compare with.  Encourage oral hydration.  Avoid nephrotoxins.  Increased total protein, ? Inflammation. Check HIV, SPEP  Follow Up  Instructions:  Follow-up in 2 months.  I discussed the assessment and treatment plan with the patient. The patient was provided an opportunity to ask questions and all were answered. The patient agreed with the plan and demonstrated an understanding of the instructions.  The patient was advised to call back or seek an in-person evaluation if the symptoms worsen or if the condition fails to improve as anticipated.   Here he has been Rickard Patience, MD 04/30/2021 3:39 PM

## 2021-05-02 IMAGING — US US RENAL
1 series · 14 of 25 positions shown · non-contrast
Comparison: None.

CLINICAL DATA: Microscopic hematuria

EXAM:
RENAL / URINARY TRACT ULTRASOUND COMPLETE

[Series 1: us renal · 14 of 32 slices shown]
[im 1/32]
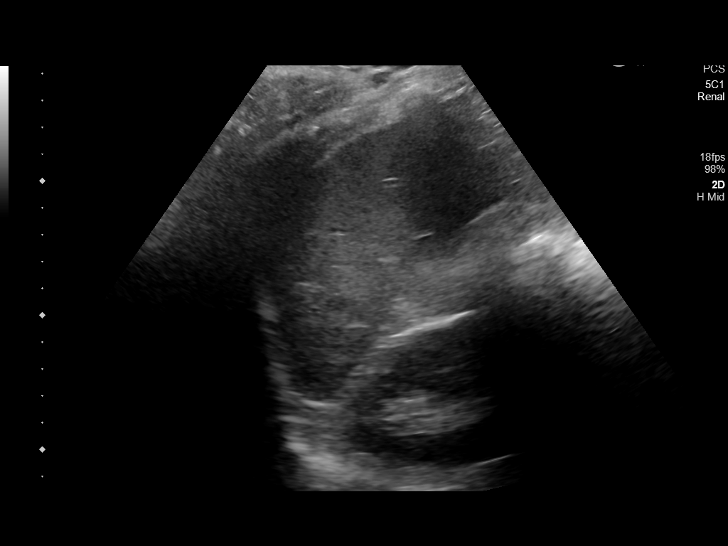
[im 3/32]
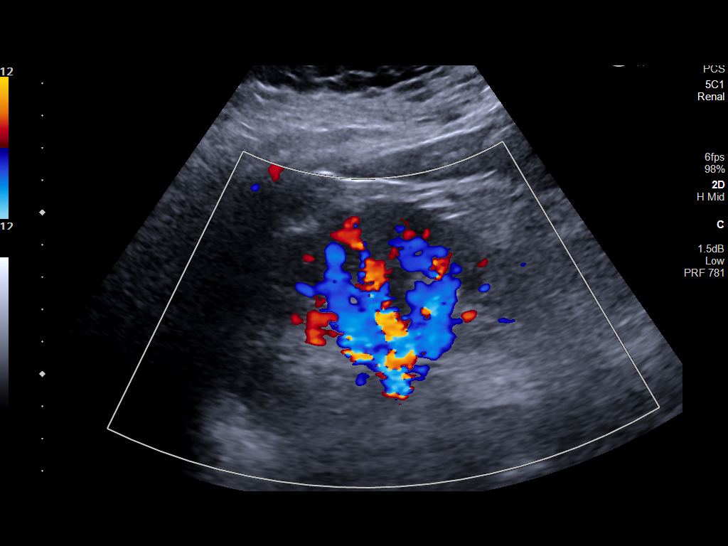
[im 6/32]
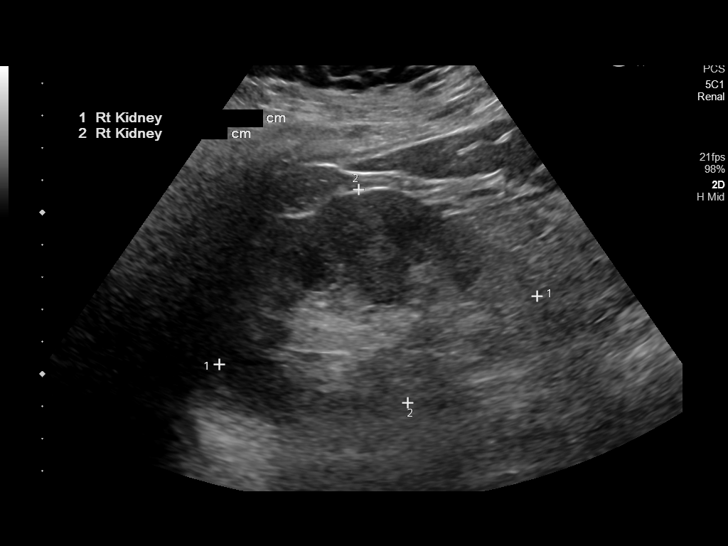
[im 8/32]
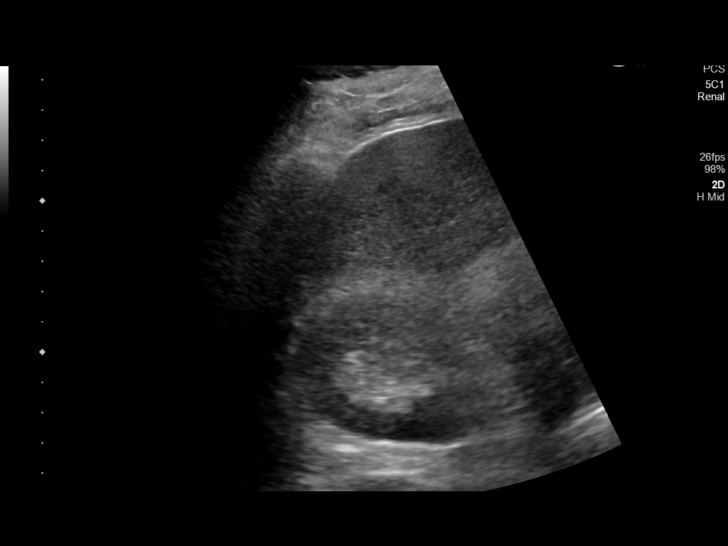
[im 11/32]
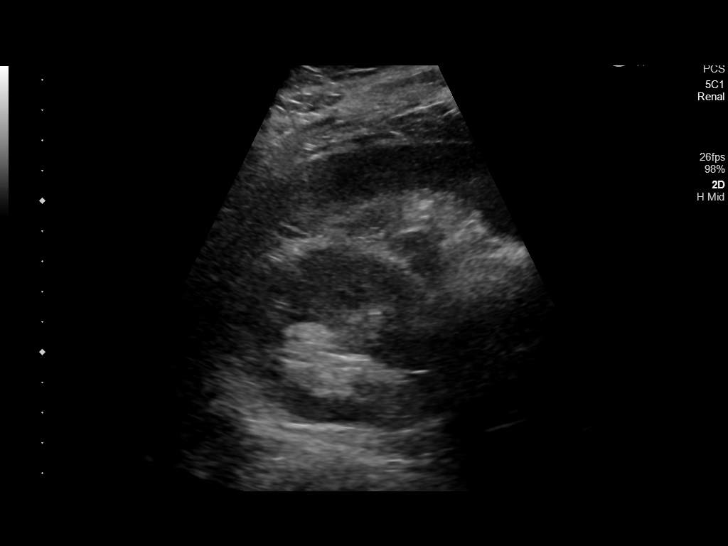
[im 12/32]
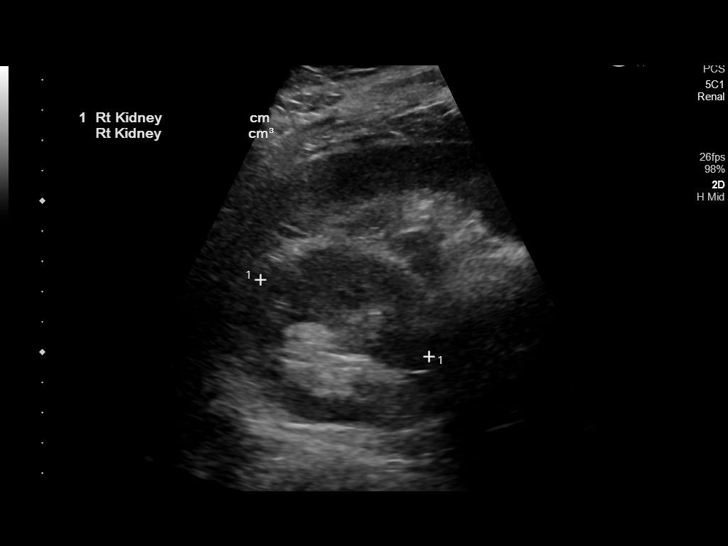
[im 15/32]
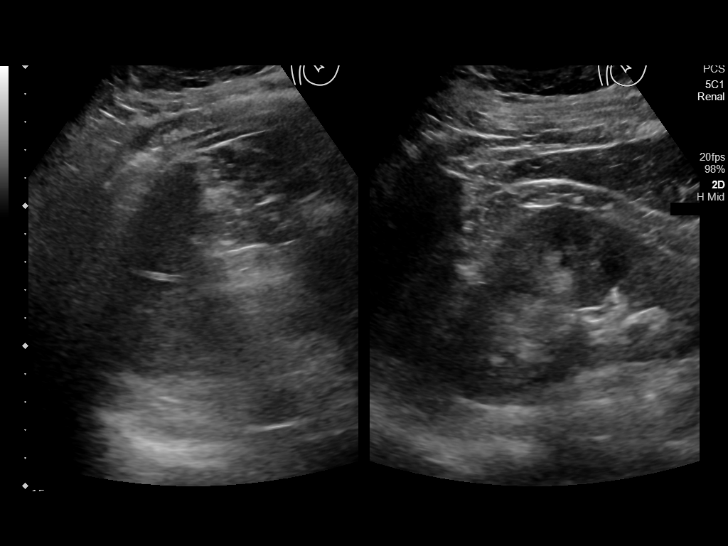
[im 17/32]
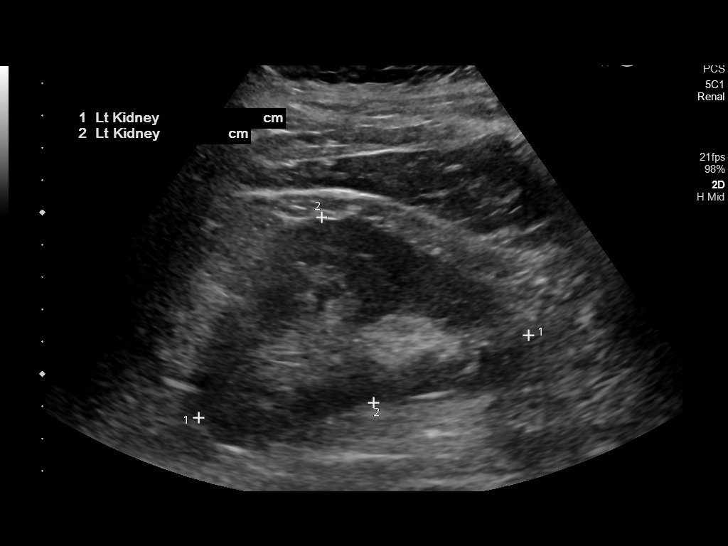
[im 20/32]
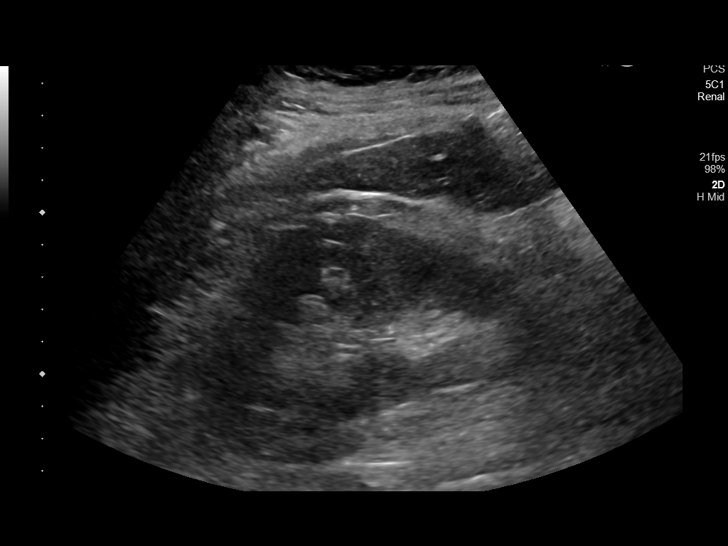
[im 21/32]
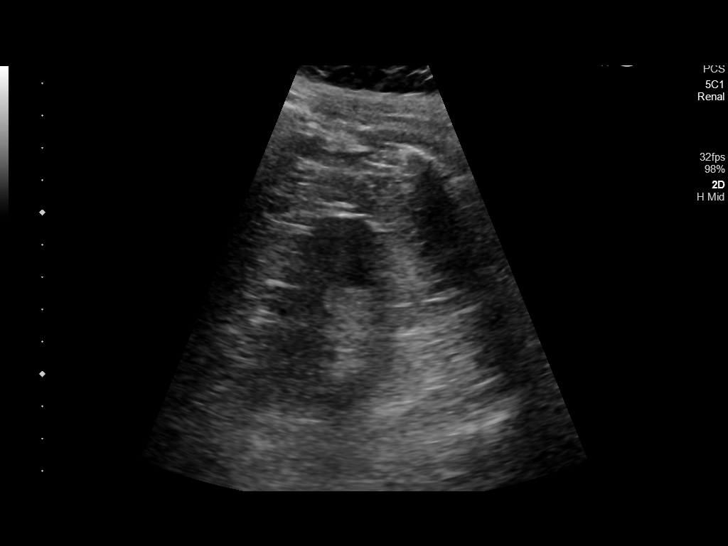
[im 24/32]
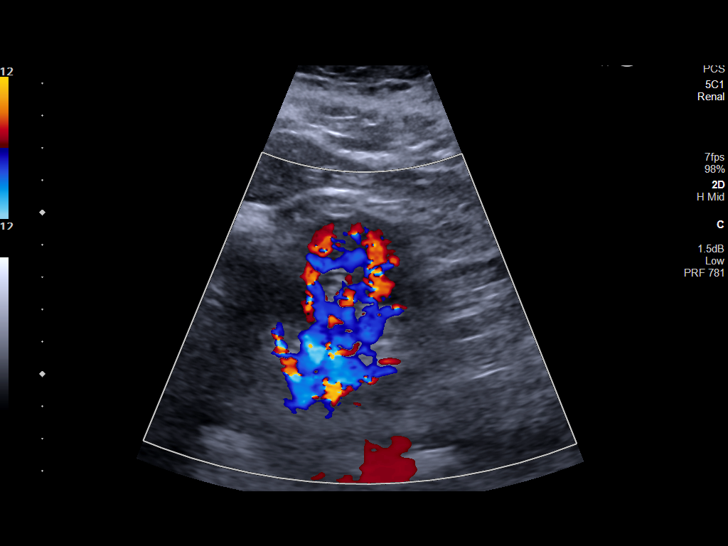
[im 26/32]
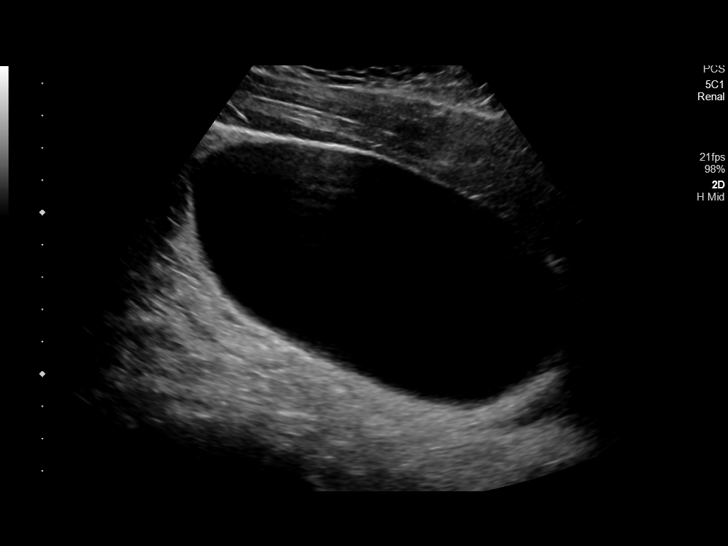
[im 29/32]
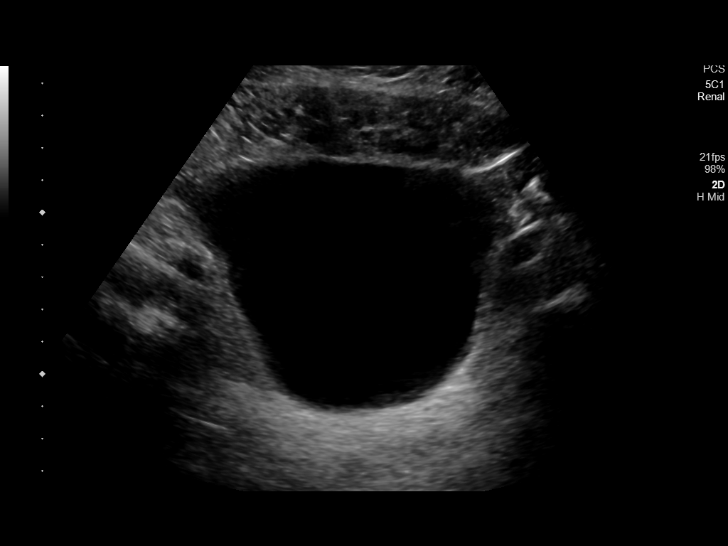
[im 32/32]
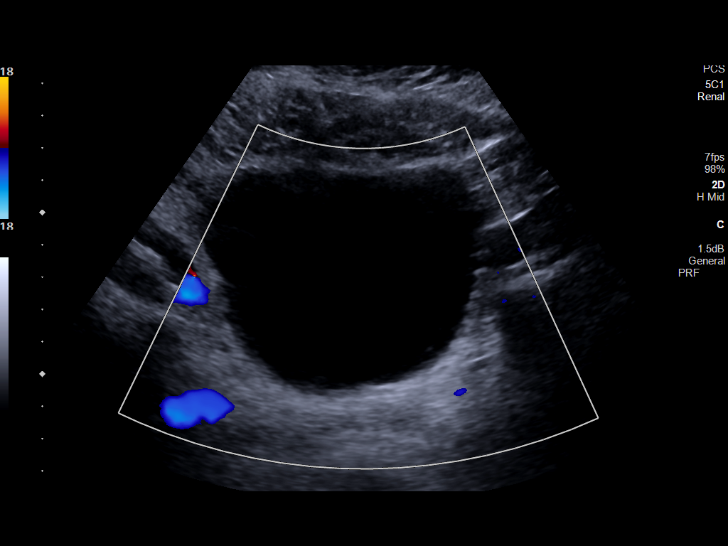

[14 of 25 positions shown; findings below may reference images not displayed]

FINDINGS: Right Kidney:

Renal measurements: 10.1 x 6.8 x 6.1 cm = volume: 218.6 mL .
Echogenicity within normal limits. No mass or hydronephrosis
visualized.

Left Kidney:

Renal measurements: 10.5 x 6 x 4.9 cm = volume: 159.4 mL.
Echogenicity within normal limits. No mass or hydronephrosis
visualized.

Bladder:

Appears normal for degree of bladder distention.

Other:

None.
IMPRESSION: Negative examination

## 2021-06-25 ENCOUNTER — Inpatient Hospital Stay: Payer: Medicaid Other | Attending: Oncology

## 2021-06-25 DIAGNOSIS — D72829 Elevated white blood cell count, unspecified: Secondary | ICD-10-CM | POA: Insufficient documentation

## 2021-06-25 DIAGNOSIS — Z720 Tobacco use: Secondary | ICD-10-CM

## 2021-06-25 DIAGNOSIS — R778 Other specified abnormalities of plasma proteins: Secondary | ICD-10-CM

## 2021-06-25 LAB — CBC WITH DIFFERENTIAL/PLATELET
Abs Immature Granulocytes: 0.04 10*3/uL (ref 0.00–0.07)
Basophils Absolute: 0.1 10*3/uL (ref 0.0–0.1)
Basophils Relative: 1 %
Eosinophils Absolute: 0.4 10*3/uL (ref 0.0–0.5)
Eosinophils Relative: 3 %
HCT: 46.9 % (ref 39.0–52.0)
Hemoglobin: 15.1 g/dL (ref 13.0–17.0)
Immature Granulocytes: 0 %
Lymphocytes Relative: 30 %
Lymphs Abs: 4.4 10*3/uL — ABNORMAL HIGH (ref 0.7–4.0)
MCH: 27.5 pg (ref 26.0–34.0)
MCHC: 32.2 g/dL (ref 30.0–36.0)
MCV: 85.3 fL (ref 80.0–100.0)
Monocytes Absolute: 0.8 10*3/uL (ref 0.1–1.0)
Monocytes Relative: 5 %
Neutro Abs: 8.9 10*3/uL — ABNORMAL HIGH (ref 1.7–7.7)
Neutrophils Relative %: 61 %
Platelets: 263 10*3/uL (ref 150–400)
RBC: 5.5 MIL/uL (ref 4.22–5.81)
RDW: 13.2 % (ref 11.5–15.5)
WBC: 14.7 10*3/uL — ABNORMAL HIGH (ref 4.0–10.5)
nRBC: 0 % (ref 0.0–0.2)

## 2021-06-25 LAB — HIV ANTIBODY (ROUTINE TESTING W REFLEX): HIV Screen 4th Generation wRfx: NONREACTIVE

## 2021-06-25 LAB — COMPREHENSIVE METABOLIC PANEL
ALT: 21 U/L (ref 0–44)
AST: 18 U/L (ref 15–41)
Albumin: 4.3 g/dL (ref 3.5–5.0)
Alkaline Phosphatase: 88 U/L (ref 38–126)
Anion gap: 11 (ref 5–15)
BUN: 19 mg/dL (ref 6–20)
CO2: 25 mmol/L (ref 22–32)
Calcium: 9.6 mg/dL (ref 8.9–10.3)
Chloride: 99 mmol/L (ref 98–111)
Creatinine, Ser: 1.28 mg/dL — ABNORMAL HIGH (ref 0.61–1.24)
GFR, Estimated: >60 mL/min (ref >60)
Glucose, Bld: 98 mg/dL (ref 70–99)
Potassium: 3.8 mmol/L (ref 3.5–5.1)
Sodium: 135 mmol/L (ref 135–145)
Total Bilirubin: 0.4 mg/dL (ref 0.3–1.2)
Total Protein: 8 g/dL (ref 6.5–8.1)

## 2021-06-27 LAB — PROTEIN ELECTROPHORESIS, SERUM
A/G Ratio: 1.3 (ref 0.7–1.7)
Albumin ELP: 4.1 g/dL (ref 2.9–4.4)
Alpha-1-Globulin: 0.2 g/dL (ref 0.0–0.4)
Alpha-2-Globulin: 0.7 g/dL (ref 0.4–1.0)
Beta Globulin: 1 g/dL (ref 0.7–1.3)
Gamma Globulin: 1.3 g/dL (ref 0.4–1.8)
Globulin, Total: 3.2 g/dL (ref 2.2–3.9)
Total Protein ELP: 7.3 g/dL (ref 6.0–8.5)

## 2021-07-02 ENCOUNTER — Other Ambulatory Visit: Payer: Self-pay

## 2021-07-02 ENCOUNTER — Inpatient Hospital Stay (HOSPITAL_BASED_OUTPATIENT_CLINIC_OR_DEPARTMENT_OTHER): Payer: Self-pay | Admitting: Oncology

## 2021-07-02 DIAGNOSIS — R778 Other specified abnormalities of plasma proteins: Secondary | ICD-10-CM

## 2021-07-02 DIAGNOSIS — D72829 Elevated white blood cell count, unspecified: Secondary | ICD-10-CM

## 2021-07-02 DIAGNOSIS — F17210 Nicotine dependence, cigarettes, uncomplicated: Secondary | ICD-10-CM

## 2021-07-02 DIAGNOSIS — Z720 Tobacco use: Secondary | ICD-10-CM

## 2021-07-02 NOTE — Progress Notes (Signed)
HEMATOLOGY-ONCOLOGY TeleHEALTH VISIT PROGRESS NOTE   I connected with William Cabrera on 07/02/21  at  3:30 PM EDT by video enabled telemedicine visit and verified that I am speaking with the correct person using two identifiers. I discussed the limitations, risks, security and privacy concerns of performing an evaluation and management service by telemedicine and the availability of in-person appointments. The patient expressed understanding and agreed to proceed.   Other persons participating in the visit and their role in the encounter:  None  Patient's location: Home  Provider's location: office  Chief Complaint: Follow-up for leukocytosis work-up.  INTERVAL HISTORY William Cabrera is a 40 y.o. male who presents via telephone call to review most recent lab work.  He reports feeling well since his last visit.  He has been trying to cut back on his cigarette smoking and has been quite successful.  Patient did have COVID at the end of June/early July 2022.  Reports he has recovered.   Review of Systems  Constitutional: Negative.  Negative for appetite change, chills, fatigue and fever.  HENT:  Negative.  Negative for hearing loss, lump/mass, mouth sores and nosebleeds.   Eyes: Negative.  Negative for eye problems.  Respiratory:  Negative for cough, hemoptysis and shortness of breath.   Cardiovascular: Negative.  Negative for chest pain and leg swelling.  Gastrointestinal: Negative.  Negative for abdominal pain, blood in stool, constipation, diarrhea, nausea and vomiting.  Endocrine: Negative.  Negative for hot flashes.  Genitourinary: Negative.  Negative for bladder incontinence, difficulty urinating, dysuria, frequency and hematuria.   Musculoskeletal: Negative.  Negative for back pain, flank pain, gait problem and myalgias.  Skin: Negative.  Negative for itching and rash.  Neurological: Negative.  Negative for dizziness, gait problem, headaches, light-headedness and numbness.   Hematological: Negative.  Negative for adenopathy.  Psychiatric/Behavioral:  Negative for confusion. The patient is not nervous/anxious.    Past Medical History:  Diagnosis Date   Hyperlipemia    Hypertension    Past Surgical History:  Procedure Laterality Date   FINGER NAIL SURGERY     child    Family History  Problem Relation Age of Onset   Colon cancer Maternal Grandfather    Multiple sclerosis Mother    Hypertension Father    High Cholesterol Father    Breast cancer Maternal Aunt    Bladder Cancer Neg Hx    Kidney cancer Neg Hx    Prostate cancer Neg Hx     Social History   Socioeconomic History   Marital status: Married    Spouse name: Not on file   Number of children: Not on file   Years of education: Not on file   Highest education level: Not on file  Occupational History   Not on file  Tobacco Use   Smoking status: Every Day    Packs/day: 1.00    Years: 24.00    Pack years: 24.00    Types: Cigarettes   Smokeless tobacco: Former  Building services engineer Use: Never used  Substance and Sexual Activity   Alcohol use: Not Currently   Drug use: Not Currently    Types: Marijuana    Comment: Marijuana in the past    Sexual activity: Yes    Birth control/protection: None  Other Topics Concern   Not on file  Social History Narrative   Not on file   Social Determinants of Health   Financial Resource Strain: Not on file  Food Insecurity: Not on file  Transportation Needs: Not on file  Physical Activity: Not on file  Stress: Not on file  Social Connections: Not on file  Intimate Partner Violence: Not on file    Current Outpatient Medications on File Prior to Visit  Medication Sig Dispense Refill   atorvastatin (LIPITOR) 10 MG tablet Take 10 mg by mouth daily.     LOSARTAN POTASSIUM PO Take by mouth daily at 2 PM.     tadalafil (CIALIS) 10 MG tablet Take 1-2 tablets (10-20 mg total) by mouth daily as needed for erectile dysfunction. 30 tablet 6   VITAMIN D  PO Take by mouth daily at 2 PM.     No current facility-administered medications on file prior to visit.    No Known Allergies     Observations/Objective: There were no vitals filed for this visit.  There is no height or weight on file to calculate BMI.  Physical Exam Neurological:     Mental Status: He is alert.    CBC    Component Value Date/Time   WBC 14.7 (H) 06/25/2021 1524   RBC 5.50 06/25/2021 1524   HGB 15.1 06/25/2021 1524   HCT 46.9 06/25/2021 1524   PLT 263 06/25/2021 1524   MCV 85.3 06/25/2021 1524   MCH 27.5 06/25/2021 1524   MCHC 32.2 06/25/2021 1524   RDW 13.2 06/25/2021 1524   LYMPHSABS 4.4 (H) 06/25/2021 1524   MONOABS 0.8 06/25/2021 1524   EOSABS 0.4 06/25/2021 1524   BASOSABS 0.1 06/25/2021 1524    CMP     Component Value Date/Time   NA 135 06/25/2021 1524   K 3.8 06/25/2021 1524   CL 99 06/25/2021 1524   CO2 25 06/25/2021 1524   GLUCOSE 98 06/25/2021 1524   BUN 19 06/25/2021 1524   CREATININE 1.28 (H) 06/25/2021 1524   CALCIUM 9.6 06/25/2021 1524   PROT 8.0 06/25/2021 1524   ALBUMIN 4.3 06/25/2021 1524   AST 18 06/25/2021 1524   ALT 21 06/25/2021 1524   ALKPHOS 88 06/25/2021 1524   BILITOT 0.4 06/25/2021 1524   GFRNONAA >60 06/25/2021 1524     Assessment and Plan: 1. Leukocytosis, unspecified type   2. Tobacco use   3. Elevated total protein     Leukocytosis- Likely secondary to cigarette smoking.  He has cut back his cigarettes quite a bit. Labs from 06/25/2021 show improvement of his leukocytosis. White count is 14.7, neutrophils 8.9 and lymphocytes 4.4. Previous work-up showed negative hepatitis panel, normal LDH and negative flow cytometry. His lymphocytosis is due to an increase of CD4+ T cells and a CD8+ T cells and B cells, increase of the multiple lymphocytes suggest a reactive process.   He continues to have eosinophilia likely secondary to inflammation but this is also improving.  Recent COVID-19 infection- Tested  positive for COVID at the end of June 2022. Feels that he has completely recovered.  Elevated kidney function- Creatinine continues to be elevated and is 1.28-GFR is greater than 60.  Encouraged oral hydration and to avoid nephrotoxic medications.  Increased total protein- Question inflammation. HIV and SPEP were negative and or nonreactive.  Disposition- Return to clinic for virtual visit in 4 months with labs (CBC, CMP) and telephone call.  I spent 15 minutes dedicated to the care of this patient (face-to-face and non-face-to-face) on the date of the encounter to include what is described in the assessment and plan.  Mauro Kaufmann, NP 07/02/2021 4:07 PM

## 2022-01-26 ENCOUNTER — Ambulatory Visit
Admission: EM | Admit: 2022-01-26 | Discharge: 2022-01-26 | Disposition: A | Discharge status: Home / Self Care | Payer: 59

## 2022-01-26 ENCOUNTER — Encounter: Payer: Self-pay | Admitting: Emergency Medicine

## 2022-01-26 ENCOUNTER — Other Ambulatory Visit: Payer: Self-pay

## 2022-01-26 DIAGNOSIS — L03012 Cellulitis of left finger: Secondary | ICD-10-CM

## 2022-01-26 MED ORDER — PREDNISONE 20 MG PO TABS: 40.0000 mg | ORAL_TABLET | Freq: Every day | ORAL | 0 refills | Status: AC

## 2022-01-26 MED ORDER — IBUPROFEN 800 MG PO TABS
800.0000 mg | ORAL_TABLET | Freq: Three times a day (TID) | ORAL | 0 refills | Status: DC | PRN
Start: 2022-01-26 — End: 2024-08-20

## 2022-01-26 MED ORDER — DOXYCYCLINE HYCLATE 100 MG PO CAPS: 100.0000 mg | ORAL_CAPSULE | Freq: Two times a day (BID) | ORAL | 0 refills | Status: DC

## 2022-01-26 NOTE — ED Provider Notes (Signed)
Renaldo Fiddler    CSN: [id number] Arrival date & time: 01/26/22  1030      History   Chief Complaint Chief Complaint  Patient presents with   Finger Swelling    HPI William Cabrera is a 41 y.o. male.   HPI Patient r  Past Medical History:  Diagnosis Date   Hyperlipemia    Hypertension     There are no problems to display for this patient.   Past Surgical History:  Procedure Laterality Date   FINGER NAIL SURGERY     child       Home Medications    Prior to Admission medications   Medication Sig Start Date End Date Taking? Authorizing Provider  hydrochlorothiazide (HYDRODIURIL) 25 MG tablet Take 25 mg by mouth daily.   Yes [provider]  atorvastatin (LIPITOR) 10 MG tablet Take 10 mg by mouth daily.    [provider]  LOSARTAN POTASSIUM PO Take by mouth daily at 2 PM.    [provider]  tadalafil (CIALIS) 10 MG tablet Take 1-2 tablets (10-20 mg total) by mouth daily as needed for erectile dysfunction. 12/19/20   Sondra Come, MD  VITAMIN D PO Take by mouth daily at 2 PM.    [provider]    Family History Family History  Problem Relation Age of Onset   Colon cancer Maternal Grandfather    Multiple sclerosis Mother    Hypertension Father    High Cholesterol Father    Breast cancer Maternal Aunt    Bladder Cancer Neg Hx    Kidney cancer Neg Hx    Prostate cancer Neg Hx     Social History Social History   Tobacco Use   Smoking status: Every Day    Packs/day: 1.00    Years: 24.00    Pack years: 24.00    Types: Cigarettes   Smokeless tobacco: Former  Building services engineer Use: Never used  Substance Use Topics   Alcohol use: Not Currently   Drug use: Not Currently    Types: Marijuana    Comment: Marijuana in the past      Allergies   Patient has no known allergies.   Review of Systems Review of Systems   Physical Exam Triage Vital Signs ED Triage Vitals  Enc Vitals Group     BP --       Pulse Rate 01/26/22 1212 85     Resp 01/26/22 1212 18     Temp 01/26/22 1212 99.5 F (37.5 C)     Temp Source 01/26/22 1212 Oral     SpO2 01/26/22 1212 93 %     Weight --      Height --      Head Circumference --      Peak Flow --      Pain Score 01/26/22 1211 6     Pain Loc --      Pain Edu? --      Excl. in GC? --    No data found.  Updated Vital Signs Pulse 85    Temp 99.5 F (37.5 C) (Oral)    Resp 18    SpO2 93%   Visual Acuity Right Eye Distance:   Left Eye Distance:   Bilateral Distance:    Right Eye Near:   Left Eye Near:    Bilateral Near:     Physical Exam Constitutional:      Appearance: He is obese.  HENT:  Head: Normocephalic and atraumatic.  Cardiovascular:     Rate and Rhythm: Normal rate and regular rhythm.  Pulmonary:     Effort: Pulmonary effort is normal. No accessory muscle usage.  Musculoskeletal:     Left hand: Swelling present.       Arms:  Skin:    Capillary Refill: Capillary refill takes less than 2 seconds.  Neurological:     General: No focal deficit present.     Mental Status: He is oriented to person, place, and time.  Psychiatric:        Behavior: Behavior is cooperative.     UC Treatments / Results  Labs (all labs ordered are listed, but only abnormal results are displayed) Labs Reviewed - No data to display  EKG   Radiology No results found.  Procedures Incision and Drainage  Date/Time: 01/26/2022 12:23 PM Performed by: Bing Neighbors, FNP Authorized by: Bing Neighbors, FNP   Consent:    Consent obtained:  Verbal   Consent given by:  Patient   Risks discussed:  Bleeding, incomplete drainage and infection   Alternatives discussed:  No treatment Universal protocol:    Patient identity confirmed:  Verbally with patient Location:    Type:  Abscess   Size:  Undefined Paronchyia   Location:  Upper extremity   Upper extremity location:  Finger   Finger location:  L index finger Pre-procedure  details:    Skin preparation:  Povidone-iodine Anesthesia:    Anesthesia method:  Topical application and local infiltration   Topical anesthetic:  Lidocaine gel   Local anesthetic:  Lidocaine 2% w/o epi Procedure type:    Complexity:  Simple Procedure details:    Incision types:  Stab incision   Incision depth:  Subcutaneous   Drainage:  Bloody   Drainage amount:  Moderate   Wound treatment:  Wound left open Post-procedure details:    Procedure completion:  Tolerated Comments:     I&D unsuccessful, no significant drainage or pus retrieved from I&D.Post start of procedure, patient  informed me that last paronychia required surgical intervention by hand specialist. Procedure aborted after 3rd stab incision due to only scant amount of blood expressed from finger. (including critical care time)  Medications Ordered in UC Medications - No data to display  Initial Impression / Assessment and Plan / UC Course  I have reviewed the triage vital signs and the nursing notes.  Pertinent labs & imaging results that were available during my care of the patient were reviewed by me and considered in my medical decision making (see chart for details).    I&D of paronychia left index finger, tolerated,although unsuccessful in draining. Recommend pressure dressing, given made 3 small incisions, possible finger may drainage once prednisone and antibiotics are initiated. Advised to follow-up with [facility] if no significant improvement is seen within 3-5 days.Patient verbalized understanding and agreement with plan  Final Clinical Impressions(s) / UC Diagnoses   Final diagnoses:  None   Discharge Instructions   None    ED Prescriptions   None    PDMP not reviewed this encounter.   Bing Neighbors, [place] 01/28/22 [id number]

## 2022-01-26 NOTE — ED Triage Notes (Signed)
Pt presents with left pointer finger swelling and pain. Pt states some puss is coming from finger for several weeks.

## 2022-03-05 ENCOUNTER — Telehealth: Payer: Self-pay

## 2022-03-05 ENCOUNTER — Telehealth: Payer: Self-pay | Admitting: Oncology

## 2022-03-05 ENCOUNTER — Encounter: Payer: Self-pay | Admitting: Internal Medicine

## 2022-03-05 NOTE — Telephone Encounter (Signed)
Scheduling message sent to schedule MD only in 2-3 weeks  ?

## 2022-03-05 NOTE — Telephone Encounter (Signed)
Scheduled per 3/29 secure chat, message has been left with pt ?

## 2022-03-05 NOTE — Telephone Encounter (Signed)
-----   Message from [facility] sent at 03/05/2022 11:02 AM EDT ----- ?Regarding: LABS/APPT ?Dr Dario Guardian sent labs and asked for [place] to schedule a f/u if necessary on this patient. I sent labs to you. ? ?

## 2022-03-06 ENCOUNTER — Ambulatory Visit: Payer: 59 | Admitting: Orthopedic Surgery

## 2022-03-06 ENCOUNTER — Encounter: Payer: Self-pay | Admitting: Orthopedic Surgery

## 2022-03-06 ENCOUNTER — Other Ambulatory Visit: Payer: Self-pay | Admitting: Specialist

## 2022-03-06 DIAGNOSIS — L03012 Cellulitis of left finger: Secondary | ICD-10-CM | POA: Insufficient documentation

## 2022-03-06 MED ORDER — OXYCODONE HCL 5 MG PO TABS: 5.0000 mg | ORAL_TABLET | Freq: Four times a day (QID) | ORAL | 0 refills | Status: AC | PRN

## 2022-03-06 MED ORDER — CEPHALEXIN 500 MG PO CAPS: 500.0000 mg | ORAL_CAPSULE | Freq: Four times a day (QID) | ORAL | 0 refills | Status: AC

## 2022-03-06 NOTE — Progress Notes (Signed)
? ?Office Visit Note ?  ?Patient: William Cabrera           ?Date of Birth: December 12, 1980           ?MRN: [id number] ?Visit Date: 03/06/2022 ?             ?Requested by: Sherrie Mustache, MD ?[phone] ?[place],  [place] [address] ?PCP: Sherrie Mustache, MD ? ? ?Assessment & Plan: ?Visit Diagnoses:  ?1. Paronychia, finger, left   ? ? ?Plan: Exam of the left index finger demonstrates expressible purulence from under the nail plate.  The nail plate appeared to be nonadherent at the level of the hyponychium.  Discussed with patient that he likely has a paronychial infection.   The nail plate was removed under digital block.  The nail plate was essentially non adherent to the underlying nailbed.  A small incision was made at the lateral aspect of the fingertip with no evidence of felon.  The paronychia was thoroughly irrigated with copious sterile saline.  Xeroform was placed underneath the proximal nail fold.  Wound was dressed with 4 x 4's, Kling wrap, Coban.  I will see him back next week for another wound check.  I will also send a prescription for an antibiotic to his pharmacy. ? ?Follow-Up Instructions: No follow-ups on file.  ? ?Orders:  ?No orders of the defined types were placed in this encounter. ? ?No orders of the defined types were placed in this encounter. ? ? ? ? Procedures: ?Incision & Drainage ? ?Date/Time: 03/06/2022 5:40 PM ?Performed by: Marlyne Beards, MD ?Authorized by: Marlyne Beards, MD  ? ?Consent:  ?  Consent obtained:  Verbal ?  Consent given by:  Patient ?  Risks, benefits, and alternatives were discussed: yes   ?  Risks discussed:  Incomplete drainage, pain and infection ?  Alternatives discussed:  No treatment and delayed treatment ?Location:  ?  Type:  Abscess ?  Location:  Upper extremity ?  Upper extremity location:  Finger ?  Finger location:  L index finger ?Pre-procedure details:  ?  Skin preparation:  Alcohol and chlorhexidine ?Anesthesia:  ?  Anesthesia method:  Nerve block ?  Block  needle gauge:  25 G ?  Block anesthetic:  Lidocaine 1% w/o epi ?  Block technique:  Digital block ?  Block injection procedure:  Introduced needle and incremental injection ?Procedure type:  ?  Complexity:  Complex ?Procedure details:  ?  Needle aspiration: no   ?  Incision types:  Single straight ?  Incision depth:  Subungual ?  Scalpel blade:  11 ?  Wound management:  Irrigated with saline and debrided ?  Drainage:  Purulent ?  Drainage amount:  Scant ?  Wound treatment:  Wound left open ?  Packing materials:  None ?Post-procedure details:  ?  Procedure completion:  Tolerated well, no immediate complications ?Comments:  ?   Purulence under nail plate.  Digital block performed,  Nail plate removed with freer elevator.  Nail was non adherent to nail bed so removal was much easier than expected.  Scant purulence under nail plate.  Incision was made at lateral aspect of finger and bluntly dissected with scissor.   No purulence to suggest a felon.  Wound thoroughly irrigated and dressed with xeroform, 4x4, cling wrap, and loose Coban.  ? ? ?Clinical Data: ?No additional findings. ? ? ?Subjective: ?Chief Complaint  ?Patient presents with  ? Left Index Finger - New Patient (Initial Visit)  ? ? 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  This been going on for quite some time.  He thinks it started around December.  He denies any injury that he is aware of.  He was seen at an urgent care in February where an I&D was performed of the finger but there was no purulence encountered.  He was discharged on p.o. antibiotics and a corticosteroid.  Since then he has had pain at the tip of the finger.  He will occasionally soak his hand in warm soapy water such as after doing dishes and notes that there is some purulence coming from underneath the nail at that time.   He had a  similar issue with his right index finger in approximately 2020 and underwent I&D of the index finger with removal of the nail plate.  This resolved the infection in his nail eventually grew back.  He denies any pain in the pulp of his fingertip or proximal to the DIP joint.  He denies any systemic symptoms. ? ? ?Review of Systems ? ? ?Objective: ?Vital Signs: There were no vitals taken for this visit. ? ?Physical Exam ?Constitutional:   ?   Appearance: Normal appearance.  ?Cardiovascular:  ?   Rate and Rhythm: Normal rate.  ?   Pulses: Normal pulses.  ?Pulmonary:  ?   Effort: Pulmonary effort is normal.  ?Skin: ?   General: Skin is warm and dry.  ?   Capillary Refill: Capillary refill takes less than 2 seconds.  ?Neurological:  ?   Mental Status: He is alert.  ? ? ?Left Hand Exam  ? ?Tenderness  ?Left hand tenderness location: TTP at tip of index finger and under nail plate.  ? ?Other  ?Erythema: absent ?Sensation: normal ?Pulse: present ? ?Comments:  Expressible purulence from under nail plate at hyponychium.  Nail appears non adherent distally. Thickened, callus appearing tissue at hyponychium and tip of finger.  No purulence around proximal or lateral nail folds.  No pain w/ ROM of DIP joint.  No tension, fluctuance, or tenderness at finger pulp.  ? ? ? ? ?Specialty Comments:  ?No specialty comments available. ? ?Imaging: ?No results found. ? ? ?PMFS History: ?Patient Active Problem List  ? Diagnosis Date Noted  ? Paronychia, finger, left 03/06/2022  ? ?Past Medical History:  ?Diagnosis Date  ? Hyperlipemia   ? Hypertension   ?  ?Family History  ?Problem Relation Age of Onset  ? Colon cancer Maternal Grandfather   ? Multiple sclerosis Mother   ? Hypertension Father   ? High Cholesterol Father   ? Breast cancer Maternal Aunt   ? Bladder Cancer Neg Hx   ? Kidney cancer Neg Hx   ? Prostate cancer Neg Hx   ?  ?Past Surgical History:  ?Procedure Laterality Date  ? FINGER NAIL SURGERY    ? child  ? ?Social History   ? ?Occupational History  ? Not on file  ?Tobacco Use  ? Smoking status: Every Day  ?  Packs/day: 1.00  ?  Years: 24.00  ?  Pack years: 24.00  ?  Types: Cigarettes  ? Smokeless tobacco: Former  ?Vaping Use  ? Vaping Use: Never used  ?Substance and Sexual Activity  ? Alcohol use: Not Currently  ? Drug use: Not Currently  ?  Types: Marijuana  ?  Comment: Marijuana in the past   ?  Sexual activity: Yes  ?  Birth control/protection: None  ? ? ? ? ? ? ?

## 2022-03-11 ENCOUNTER — Ambulatory Visit (INDEPENDENT_AMBULATORY_CARE_PROVIDER_SITE_OTHER): Payer: 59 | Admitting: Orthopedic Surgery

## 2022-03-11 ENCOUNTER — Ambulatory Visit: Payer: Self-pay

## 2022-03-11 DIAGNOSIS — L03012 Cellulitis of left finger: Secondary | ICD-10-CM

## 2022-03-19 ENCOUNTER — Encounter: Payer: Self-pay | Admitting: Oncology

## 2022-03-19 ENCOUNTER — Inpatient Hospital Stay: Payer: 59 | Attending: Oncology | Admitting: Oncology

## 2022-03-19 VITALS — BP 125/89 | HR 82 | Temp 95.9°F | Resp 18 | Wt 261.0 lb

## 2022-03-19 DIAGNOSIS — D72829 Elevated white blood cell count, unspecified: Secondary | ICD-10-CM | POA: Diagnosis not present

## 2022-03-19 DIAGNOSIS — L03012 Cellulitis of left finger: Secondary | ICD-10-CM

## 2022-03-19 NOTE — Progress Notes (Signed)
?Hematology/Oncology Consult note ?[facility] ?[phone] [phone] ? ? ?Patient Care Team: ?Casilda Carls, MD as PCP - General (Internal Medicine) ? ?REFERRING PROVIDER: ?Casilda Carls, MD  ?CHIEF COMPLAINTS/REASON FOR VISIT:  ? leukocytosis ? ?HISTORY OF PRESENTING ILLNESS:  ? ?William Cabrera is a  41 y.o.  male with PMH listed below was seen in consultation at the request of  Casilda Carls, MD  for evaluation of leukocytosis ? ?Reviewed patient's blood work done recently at the primary care provider's office.  WBC 18.  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 ?Reports a history of right index finger infection for which he was put on 1 year of fluconazole. ?Denies any unintentional weight loss, fever, night sweat ?Mentioned that he had a boil on his butt which is infected intermittently.  Patient is a current everyday smoker. ? ?INTERVAL HISTORY ?William Cabrera is a 41 y.o. male who has above history reviewed by me today presents for follow up visit for leukocytosis ?Patient was last seen by me a year ago, last follow-up. ?Recently had blood work done at PCPs office and was found to have persistent leukocytosis with neutrophilia, monocytosis, lymphocytosis and eosinophilia. ?Patient was referred back to reestablish care. ?Reports having couple months of swelling, redness, pus of left index finger.  He had a similar situation to the right index finger a few years ago.  Patient was seen by orthopedic surgeon on 02/17/2022 and was diagnosed with paronychia. ?Patient was seen by Dr. Sherilyn Cooter and had nail removed. ?Patient denies any constitutional symptoms. ? ? ?Review of Systems  ?Constitutional:  Negative for appetite change, chills, fatigue, fever and unexpected weight change.  ?HENT:   Negative for hearing loss and voice change.   ?Eyes:  Negative for eye problems and icterus.  ?Respiratory:  Negative for chest tightness, cough  and shortness of breath.   ?Cardiovascular:  Negative for chest pain and leg swelling.  ?Gastrointestinal:  Negative for abdominal distention and abdominal pain.  ?Endocrine: Negative for hot flashes.  ?Genitourinary:  Negative for difficulty urinating, dysuria and frequency.   ?Musculoskeletal:  Negative for arthralgias.  ?Skin:  Negative for itching and rash.  ?Neurological:  Negative for light-headedness and numbness.  ?Hematological:  Negative for adenopathy. Does not bruise/bleed easily.  ?Psychiatric/Behavioral:  Negative for confusion.   ? ?MEDICAL HISTORY:  ?Past Medical History:  ?Diagnosis Date  ? Hyperlipemia   ? Hypertension   ? ? ?SURGICAL HISTORY: ?Past Surgical History:  ?Procedure Laterality Date  ? FINGER NAIL SURGERY    ? child  ? ? ?SOCIAL HISTORY: ?Social History  ? ?Socioeconomic History  ? Marital status: Married  ?  Spouse name: Not on file  ? Number of children: Not on file  ? Years of education: Not on file  ? Highest education level: Not on file  ?Occupational History  ? Not on file  ?Tobacco Use  ? Smoking status: Every Day  ?  Packs/day: 1.00  ?  Years: 24.00  ?  Pack years: 24.00  ?  Types: Cigarettes  ? Smokeless tobacco: Former  ?Vaping Use  ? Vaping Use: Never used  ?Substance and Sexual Activity  ? Alcohol use: Not Currently  ? Drug use: Not Currently  ?  Types: Marijuana  ?  Comment: Marijuana in the past   ? Sexual activity: Yes  ?  Birth control/protection: None  ?Other Topics Concern  ? Not on file  ?Social History Narrative  ?  Not on file  ? ?Social Determinants of Health  ? ?Financial Resource Strain: Not on file  ?Food Insecurity: Not on file  ?Transportation Needs: Not on file  ?Physical Activity: Not on file  ?Stress: Not on file  ?Social Connections: Not on file  ?Intimate Partner Violence: Not on file  ? ? ?FAMILY HISTORY: ?Family History  ?Problem Relation Age of Onset  ? Colon cancer Maternal Grandfather   ? Multiple sclerosis Mother   ? Hypertension Father   ? High  Cholesterol Father   ? Breast cancer Maternal Aunt   ? Bladder Cancer Neg Hx   ? Kidney cancer Neg Hx   ? Prostate cancer Neg Hx   ? ? ?ALLERGIES:  has No Known Allergies. ? ?MEDICATIONS:  ?Current Outpatient Medications  ?Medication Sig Dispense Refill  ? atorvastatin (LIPITOR) 10 MG tablet Take 10 mg by mouth daily.    ? Cholecalciferol (VITAMIN D3) 100000 UNIT/GM POWD Take by mouth.    ? hydrochlorothiazide (HYDRODIURIL) 25 MG tablet Take 25 mg by mouth daily.    ? ibuprofen (ADVIL) 800 MG tablet Take 1 tablet (800 mg total) by mouth every 8 (eight) hours as needed. 21 tablet 0  ? Losartan Potassium POWD Take by mouth.    ? naproxen (EC NAPROSYN) 500 MG EC tablet Take by mouth.    ? oxyCODONE (OXY IR/ROXICODONE) 5 MG immediate release tablet Roxicodone 5 mg tablet ? Take one tablet po q 4-6 hours prn pain.    ? VITAMIN D PO Take by mouth daily at 2 PM.    ? tadalafil (CIALIS) 10 MG tablet Take 1-2 tablets (10-20 mg total) by mouth daily as needed for erectile dysfunction. (Patient not taking: Reported on 03/19/2022) 30 tablet 6  ? ?No current facility-administered medications for this visit.  ? ? ? ?PHYSICAL EXAMINATION: ?ECOG PERFORMANCE STATUS: 0 - Asymptomatic ?Vitals:  ? 03/19/22 0906  ?BP: 125/89  ?Pulse: 82  ?Resp: 18  ?Temp: (!) 95.9 ?F (35.5 ?C)  ?SpO2: 97%  ? ?Filed Weights  ? 03/19/22 0906  ?Weight: 261 lb (118.4 kg)  ? ? ?Physical Exam ?Constitutional:   ?   General: He is not in acute distress. ?HENT:  ?   Head: Normocephalic and atraumatic.  ?Eyes:  ?   General: No scleral icterus. ?Cardiovascular:  ?   Rate and Rhythm: Normal rate and regular rhythm.  ?   Heart sounds: Normal heart sounds.  ?Pulmonary:  ?   Effort: Pulmonary effort is normal. No respiratory distress.  ?   Breath sounds: No wheezing.  ?Abdominal:  ?   General: Bowel sounds are normal. There is no distension.  ?   Palpations: Abdomen is soft.  ?Musculoskeletal:     ?   General: No deformity. Normal range of motion.  ?   Cervical back:  Normal range of motion and neck supple.  ?   Comments: Left finger status post nail and movement.  In dressing.  ?Skin: ?   General: Skin is warm and dry.  ?   Findings: No erythema or rash.  ?Neurological:  ?   Mental Status: He is alert and oriented to person, place, and time. Mental status is at baseline.  ?   Cranial Nerves: No cranial nerve deficit.  ?   Coordination: Coordination normal.  ?Psychiatric:     ?   Mood and Affect: Mood normal.  ? ? ?LABORATORY DATA:  ?I have reviewed the data as listed ?Lab Results  ?Component Value  Date  ? WBC 14.7 (H) 06/25/2021  ? HGB 15.1 06/25/2021  ? HCT 46.9 06/25/2021  ? MCV 85.3 06/25/2021  ? PLT 263 06/25/2021  ? ?Recent Labs  ?  04/03/21 ?1554 06/25/21 ?1524  ?NA 136 135  ?K 4.1 3.8  ?CL 98 99  ?CO2 27 25  ?GLUCOSE 91 98  ?BUN 17 19  ?CREATININE 1.30* 1.28*  ?CALCIUM 9.8 9.6  ?GFRNONAA >60 >60  ?PROT 8.6* 8.0  ?ALBUMIN 4.4 4.3  ?AST 17 18  ?ALT 20 21  ?ALKPHOS 87 88  ?BILITOT 0.7 0.4  ? ? ?Iron/TIBC/Ferritin/ %Sat ?No results found for: IRON, TIBC, FERRITIN, IRONPCTSAT  ? ? ?RADIOGRAPHIC STUDIES: ?I have personally reviewed the radiological images as listed and agreed with the findings in the report. ?No results found. ? ? ? ?ASSESSMENT & PLAN:  ?1. Leukocytosis, unspecified type   ?2. Paronychia of finger of left hand   ? ?#Leukocytosis ?Previous work-up showed negative HIV,Negative peripheral blood flow cytometry, normal LDH, negative hepatitis panel, negative M protein on serum protein electrophoresis.  Likely reactive. ?Recent left index finger paronychia status post nail improvement, Acute inflammation may also contribute to the leukocytosis. ?Recommend patient to Repeat blood work in 2 weeks, allowing acute inflammation to resolve ?We will [id number] FISH, LDH,Protein electrophoresis smear ? ?Recurrent paronychia, follow-up with orthopedic surgeon ? ?Orders Placed This Encounter  ?Procedures  ? CBC with Differential/Platelet  ?  Standing Status:   Future  ?   Standing Expiration Date:   03/20/2023  ? Technologist smear review  ?  Standing Status:   Future  ?  Standing Expiration Date:   03/20/2023  ? Multiple Myeloma Panel (SPEP&IFE w/QIG)  ?  Standing Status:

## 2022-03-21 ENCOUNTER — Encounter: Payer: Self-pay | Admitting: Orthopedic Surgery

## 2022-03-21 ENCOUNTER — Ambulatory Visit (INDEPENDENT_AMBULATORY_CARE_PROVIDER_SITE_OTHER): Payer: 59 | Admitting: Orthopedic Surgery

## 2022-03-21 DIAGNOSIS — L03012 Cellulitis of left finger: Secondary | ICD-10-CM

## 2022-03-21 NOTE — Progress Notes (Signed)
? ?  Post-Op Visit Note ?  ?Patient: William Cabrera           ?Date of Birth: 1981/07/08           ?MRN: [id number] ?Visit Date: 03/21/2022 ?PCP: Sherrie Mustache, MD ? ? ?Assessment & Plan: ? ?Chief Complaint:  ?Chief Complaint  ?Patient presents with  ? Left Index Finger - Follow-up  ? ?Visit Diagnoses:  ?1. Paronychia, finger, left   ? ? ?Plan: Patient is now two weeks s/p I&D of presumed paronychia with removal of nail plate for subungual drainage and nonadherent nail plate.   No drainage from proximal nail fold today.  No swelling or erythema at proximal nail fold.  Incisions clean and dry.  Mild swelling of tip of finger.  Will continue daily wound care and I'll see back next week for another wound check.  ? ?Follow-Up Instructions: No follow-ups on file.  ? ?Orders:  ?No orders of the defined types were placed in this encounter. ? ?No orders of the defined types were placed in this encounter. ? ? ?Imaging: ?No results found. ? ?PMFS History: ?Patient Active Problem List  ? Diagnosis Date Noted  ? Paronychia, finger, left 03/06/2022  ? ?Past Medical History:  ?Diagnosis Date  ? Hyperlipemia   ? Hypertension   ?  ?Family History  ?Problem Relation Age of Onset  ? Colon cancer Maternal Grandfather   ? Multiple sclerosis Mother   ? Hypertension Father   ? High Cholesterol Father   ? Breast cancer Maternal Aunt   ? Bladder Cancer Neg Hx   ? Kidney cancer Neg Hx   ? Prostate cancer Neg Hx   ?  ?Past Surgical History:  ?Procedure Laterality Date  ? FINGER NAIL SURGERY    ? child  ? ?Social History  ? ?Occupational History  ? Not on file  ?Tobacco Use  ? Smoking status: Every Day  ?  Packs/day: 1.00  ?  Years: 24.00  ?  Pack years: 24.00  ?  Types: Cigarettes  ? Smokeless tobacco: Former  ?Vaping Use  ? Vaping Use: Never used  ?Substance and Sexual Activity  ? Alcohol use: Not Currently  ? Drug use: Not Currently  ?  Types: Marijuana  ?  Comment: Marijuana in the past   ? Sexual activity: Yes  ?  Birth control/protection:  None  ? ? ? ?

## 2022-03-21 NOTE — Progress Notes (Signed)
? ?  Post-Op Visit Note ?  ?Patient: William Cabrera           ?Date of Birth: 08-12-1981           ?MRN: [id number] ?Visit Date: 03/11/2022 ?PCP: Sherrie Mustache, MD ? ? ?Assessment & Plan: ? ?Chief Complaint:  ?Chief Complaint  ?Patient presents with  ? Left Index Finger - Wound Check  ? ?Visit Diagnoses:  ?1. Paronychia, finger, left   ? ? ?Plan: Patient is one week s/p I&D of presumed left index finger paronychia with subungual purulence and nonadherence of nail plate.  Incisions are clean and dry.  No drainage today.  No paronychial swelling or erythema.  Will start daily wound care with warm, soapy water and daily dressing changes.  I'll see him back in another week for a wound check.  ? ?Follow-Up Instructions: No follow-ups on file.  ? ?Orders:  ?Orders Placed This Encounter  ?Procedures  ? XR Finger Index Left  ? ?No orders of the defined types were placed in this encounter. ? ? ?Imaging: ?No results found. ? ?PMFS History: ?Patient Active Problem List  ? Diagnosis Date Noted  ? Paronychia, finger, left 03/06/2022  ? ?Past Medical History:  ?Diagnosis Date  ? Hyperlipemia   ? Hypertension   ?  ?Family History  ?Problem Relation Age of Onset  ? Colon cancer Maternal Grandfather   ? Multiple sclerosis Mother   ? Hypertension Father   ? High Cholesterol Father   ? Breast cancer Maternal Aunt   ? Bladder Cancer Neg Hx   ? Kidney cancer Neg Hx   ? Prostate cancer Neg Hx   ?  ?Past Surgical History:  ?Procedure Laterality Date  ? FINGER NAIL SURGERY    ? child  ? ?Social History  ? ?Occupational History  ? Not on file  ?Tobacco Use  ? Smoking status: Every Day  ?  Packs/day: 1.00  ?  Years: 24.00  ?  Pack years: 24.00  ?  Types: Cigarettes  ? Smokeless tobacco: Former  ?Vaping Use  ? Vaping Use: Never used  ?Substance and Sexual Activity  ? Alcohol use: Not Currently  ? Drug use: Not Currently  ?  Types: Marijuana  ?  Comment: Marijuana in the past   ? Sexual activity: Yes  ?  Birth control/protection: None  ? ? ? ?

## 2022-03-28 ENCOUNTER — Ambulatory Visit (INDEPENDENT_AMBULATORY_CARE_PROVIDER_SITE_OTHER): Payer: 59 | Admitting: Orthopedic Surgery

## 2022-03-28 DIAGNOSIS — L03012 Cellulitis of left finger: Secondary | ICD-10-CM

## 2022-03-28 NOTE — Progress Notes (Signed)
? ?  Post-Op Visit Note ?  ?Patient: William Cabrera           ?Date of Birth: 1981-11-23           ?MRN: [id number] ?Visit Date: 03/28/2022 ?PCP: Casilda Carls, MD ? ? ?Assessment & Plan: ? ?Chief Complaint:  ?Chief Complaint  ?Patient presents with  ? Left Index Finger - Wound Check  ? ?Visit Diagnoses:  ?1. Paronychia, finger, left   ? ? ?Plan: Patient is 3-week status post I&D of left presumed paronychia with removal of the nail plate.  He has been doing daily wound care at home with warm soapy water, bacitracin, and clean dressing.  His pain and sensitivity at the fingertip is improving.  He denies any drainage.  He has been doing daily wound care without issue.  He still has some mild swelling of the fingertip.  I can see him back in another 2 weeks for repeat wound check. ? ?Follow-Up Instructions: No follow-ups on file.  ? ?Orders:  ?No orders of the defined types were placed in this encounter. ? ?No orders of the defined types were placed in this encounter. ? ? ?Imaging: ?No results found. ? ?PMFS History: ?Patient Active Problem List  ? Diagnosis Date Noted  ? Paronychia, finger, left 03/06/2022  ? ?Past Medical History:  ?Diagnosis Date  ? Hyperlipemia   ? Hypertension   ?  ?Family History  ?Problem Relation Age of Onset  ? Colon cancer Maternal Grandfather   ? Multiple sclerosis Mother   ? Hypertension Father   ? High Cholesterol Father   ? Breast cancer Maternal Aunt   ? Bladder Cancer Neg Hx   ? Kidney cancer Neg Hx   ? Prostate cancer Neg Hx   ?  ?Past Surgical History:  ?Procedure Laterality Date  ? FINGER NAIL SURGERY    ? child  ? ?Social History  ? ?Occupational History  ? Not on file  ?Tobacco Use  ? Smoking status: Every Day  ?  Packs/day: 1.00  ?  Years: 24.00  ?  Pack years: 24.00  ?  Types: Cigarettes  ? Smokeless tobacco: Former  ?Vaping Use  ? Vaping Use: Never used  ?Substance and Sexual Activity  ? Alcohol use: Not Currently  ? Drug use: Not Currently  ?  Types: Marijuana  ?  Comment:  Marijuana in the past   ? Sexual activity: Yes  ?  Birth control/protection: None  ? ? ? ?

## 2022-04-02 ENCOUNTER — Inpatient Hospital Stay: Payer: 59

## 2022-04-02 DIAGNOSIS — D72829 Elevated white blood cell count, unspecified: Secondary | ICD-10-CM

## 2022-04-02 LAB — COMPREHENSIVE METABOLIC PANEL
ALT: 19 U/L (ref 0–44)
AST: 15 U/L (ref 15–41)
Albumin: 4.2 g/dL (ref 3.5–5.0)
Alkaline Phosphatase: 84 U/L (ref 38–126)
Anion gap: 7 (ref 5–15)
BUN: 18 mg/dL (ref 6–20)
CO2: 28 mmol/L (ref 22–32)
Calcium: 9.5 mg/dL (ref 8.9–10.3)
Chloride: 98 mmol/L (ref 98–111)
Creatinine, Ser: 1.33 mg/dL — ABNORMAL HIGH (ref 0.61–1.24)
GFR, Estimated: >60 mL/min (ref >60)
Glucose, Bld: 96 mg/dL (ref 70–99)
Potassium: 3.6 mmol/L (ref 3.5–5.1)
Sodium: 133 mmol/L — ABNORMAL LOW (ref 135–145)
Total Bilirubin: 0.5 mg/dL (ref 0.3–1.2)
Total Protein: 8.1 g/dL (ref 6.5–8.1)

## 2022-04-02 LAB — CBC WITH DIFFERENTIAL/PLATELET
Abs Immature Granulocytes: 0.05 10*3/uL (ref 0.00–0.07)
Basophils Absolute: 0.1 10*3/uL (ref 0.0–0.1)
Basophils Relative: 1 %
Eosinophils Absolute: 0.4 10*3/uL (ref 0.0–0.5)
Eosinophils Relative: 3 %
HCT: 47.5 % (ref 39.0–52.0)
Hemoglobin: 15.9 g/dL (ref 13.0–17.0)
Immature Granulocytes: 0 %
Lymphocytes Relative: 29 %
Lymphs Abs: 4.3 10*3/uL — ABNORMAL HIGH (ref 0.7–4.0)
MCH: 28.2 pg (ref 26.0–34.0)
MCHC: 33.5 g/dL (ref 30.0–36.0)
MCV: 84.2 fL (ref 80.0–100.0)
Monocytes Absolute: 0.9 10*3/uL (ref 0.1–1.0)
Monocytes Relative: 6 %
Neutro Abs: 9.1 10*3/uL — ABNORMAL HIGH (ref 1.7–7.7)
Neutrophils Relative %: 61 %
Platelets: 266 10*3/uL (ref 150–400)
RBC: 5.64 MIL/uL (ref 4.22–5.81)
RDW: 13.5 % (ref 11.5–15.5)
WBC: 15 10*3/uL — ABNORMAL HIGH (ref 4.0–10.5)
nRBC: 0 % (ref 0.0–0.2)

## 2022-04-02 LAB — LACTATE DEHYDROGENASE: LDH: 107 U/L (ref 98–192)

## 2022-04-02 LAB — TECHNOLOGIST SMEAR REVIEW: Plt Morphology: ADEQUATE

## 2022-04-03 LAB — KAPPA/LAMBDA LIGHT CHAINS
Kappa free light chain: 22.9 mg/L — ABNORMAL HIGH (ref 3.3–19.4)
Kappa, lambda light chain ratio: 1.92 — ABNORMAL HIGH (ref 0.26–1.65)
Lambda free light chains: 11.9 mg/L (ref 5.7–26.3)

## 2022-04-04 LAB — MULTIPLE MYELOMA PANEL, SERUM
Albumin SerPl Elph-Mcnc: 4.1 g/dL (ref 2.9–4.4)
Albumin/Glob SerPl: 1.2 (ref 0.7–1.7)
Alpha 1: 0.2 g/dL (ref 0.0–0.4)
Alpha2 Glob SerPl Elph-Mcnc: 0.8 g/dL (ref 0.4–1.0)
B-Globulin SerPl Elph-Mcnc: 1.2 g/dL (ref 0.7–1.3)
Gamma Glob SerPl Elph-Mcnc: 1.3 g/dL (ref 0.4–1.8)
Globulin, Total: 3.6 g/dL (ref 2.2–3.9)
IgA: 272 mg/dL (ref 90–386)
IgG (Immunoglobin G), Serum: 1285 mg/dL (ref 603–1613)
IgM (Immunoglobulin M), Srm: 49 mg/dL (ref 20–172)
Total Protein ELP: 7.7 g/dL (ref 6.0–8.5)

## 2022-04-07 ENCOUNTER — Telehealth: Payer: Self-pay | Admitting: Orthopedic Surgery

## 2022-04-07 NOTE — Telephone Encounter (Signed)
Patient called. Says his pain is not better. Would like a call back. [phone] ?

## 2022-04-09 LAB — BCR-ABL1 FISH
Cells Analyzed: 200
Cells Counted: 200

## 2022-04-11 ENCOUNTER — Ambulatory Visit (INDEPENDENT_AMBULATORY_CARE_PROVIDER_SITE_OTHER): Payer: 59 | Admitting: Orthopedic Surgery

## 2022-04-11 ENCOUNTER — Encounter: Payer: Self-pay | Admitting: Orthopedic Surgery

## 2022-04-11 DIAGNOSIS — L03012 Cellulitis of left finger: Secondary | ICD-10-CM

## 2022-04-15 ENCOUNTER — Other Ambulatory Visit: Payer: Self-pay | Admitting: Orthopedic Surgery

## 2022-04-15 ENCOUNTER — Telehealth: Payer: Self-pay | Admitting: Orthopedic Surgery

## 2022-04-15 MED ORDER — TRAMADOL HCL 50 MG PO TABS: 50.0000 mg | ORAL_TABLET | Freq: Four times a day (QID) | ORAL | 0 refills | Status: AC | PRN

## 2022-04-15 NOTE — Telephone Encounter (Signed)
Checking to see if the medication has been called in  ?

## 2022-04-21 ENCOUNTER — Inpatient Hospital Stay: Payer: 59 | Admitting: Oncology

## 2022-04-25 ENCOUNTER — Ambulatory Visit: Payer: 59 | Admitting: Orthopedic Surgery

## 2022-05-07 ENCOUNTER — Encounter: Payer: Self-pay | Admitting: Oncology

## 2022-05-07 ENCOUNTER — Inpatient Hospital Stay: Payer: 59 | Attending: Oncology | Admitting: Oncology

## 2022-05-07 VITALS — BP 126/90 | HR 79 | Temp 97.1°F | Resp 18 | Wt 268.0 lb

## 2022-05-07 DIAGNOSIS — Z720 Tobacco use: Secondary | ICD-10-CM

## 2022-05-07 DIAGNOSIS — D72829 Elevated white blood cell count, unspecified: Secondary | ICD-10-CM

## 2022-05-07 DIAGNOSIS — F17210 Nicotine dependence, cigarettes, uncomplicated: Secondary | ICD-10-CM | POA: Insufficient documentation

## 2022-05-07 NOTE — Progress Notes (Signed)
Hematology/Oncology Progress note [phone] [phone]      Patient Care Team: Casilda Carls, MD as PCP - General (Internal Medicine) Earlie Server, MD as Consulting Physician (Oncology)  REFERRING PROVIDER: Casilda Carls, MD  CHIEF COMPLAINTS/REASON FOR VISIT:   leukocytosis  HISTORY OF PRESENTING ILLNESS:   William Cabrera is a  41 y.o.  male with PMH listed below was seen in consultation at the request of  Casilda Carls, MD  for evaluation of leukocytosis  Reviewed patient's blood work done recently at the primary care provider's office.  WBC 18.  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 Reports a history of right index finger infection for which he was put on 1 year of fluconazole. Denies any unintentional weight loss, fever, night sweat Mentioned that he had a boil on his butt which is infected intermittently.  Patient is a current everyday smoker.   Patient was seen by orthopedic surgeon on 02/17/2022 and was diagnosed with paronychia. Patient was seen by Dr. Sherilyn Cooter and had nail removed.  INTERVAL HISTORY William Cabrera is a 41 y.o. male who has above history reviewed by me today presents for follow up visit for leukocytosis Patient had a blood work done and presents for follow-up.  Left index finger paronychia status post nail removal.  Patient reports that the wound is healing very well.   Review of Systems  Constitutional:  Negative for appetite change, chills, fatigue, fever and unexpected weight change.  HENT:   Negative for hearing loss and voice change.   Eyes:  Negative for eye problems and icterus.  Respiratory:  Negative for chest tightness, cough and shortness of breath.   Cardiovascular:  Negative for chest pain and leg swelling.  Gastrointestinal:  Negative for abdominal distention and abdominal pain.  Endocrine: Negative for hot flashes.  Genitourinary:  Negative for difficulty urinating,  dysuria and frequency.   Musculoskeletal:  Negative for arthralgias.  Skin:  Negative for itching and rash.  Neurological:  Negative for light-headedness and numbness.  Hematological:  Negative for adenopathy. Does not bruise/bleed easily.  Psychiatric/Behavioral:  Negative for confusion.    MEDICAL HISTORY:  Past Medical History:  Diagnosis Date   Hyperlipemia    Hypertension     SURGICAL HISTORY: Past Surgical History:  Procedure Laterality Date   FINGER NAIL SURGERY     child    SOCIAL HISTORY: Social History   Socioeconomic History   Marital status: Married    Spouse name: Not on file   Number of children: Not on file   Years of education: Not on file   Highest education level: Not on file  Occupational History   Not on file  Tobacco Use   Smoking status: Every Day    Packs/day: 1.00    Years: 24.00    Pack years: 24.00    Types: Cigarettes   Smokeless tobacco: Former  Scientific laboratory technician Use: Never used  Substance and Sexual Activity   Alcohol use: Not Currently   Drug use: Not Currently    Types: Marijuana    Comment: Marijuana in the past    Sexual activity: Yes    Birth control/protection: None  Other Topics Concern   Not on file  Social History Narrative   Not on file   Social Determinants of Health   Financial Resource Strain: Not on file  Food Insecurity: Not on file  Transportation Needs: Not on file  Physical Activity: Not  on file  Stress: Not on file  Social Connections: Not on file  Intimate Partner Violence: Not on file    FAMILY HISTORY: Family History  Problem Relation Age of Onset   Colon cancer Maternal Grandfather    Multiple sclerosis Mother    Hypertension Father    High Cholesterol Father    Breast cancer Maternal Aunt    Bladder Cancer Neg Hx    Kidney cancer Neg Hx    Prostate cancer Neg Hx     ALLERGIES:  has No Known Allergies.  MEDICATIONS:  Current Outpatient Medications  Medication Sig Dispense Refill    atorvastatin (LIPITOR) 10 MG tablet Take 10 mg by mouth daily.     hydrochlorothiazide (HYDRODIURIL) 25 MG tablet Take 25 mg by mouth daily.     Losartan Potassium POWD Take by mouth.     Cholecalciferol (VITAMIN D3) 100000 UNIT/GM POWD Take by mouth. (Patient not taking: Reported on 05/07/2022)     ibuprofen (ADVIL) 800 MG tablet Take 1 tablet (800 mg total) by mouth every 8 (eight) hours as needed. (Patient not taking: Reported on 05/07/2022) 21 tablet 0   oxyCODONE (OXY IR/ROXICODONE) 5 MG immediate release tablet Roxicodone 5 mg tablet  Take one tablet po q 4-6 hours prn pain. (Patient not taking: Reported on 05/07/2022)     tadalafil (CIALIS) 10 MG tablet Take 1-2 tablets (10-20 mg total) by mouth daily as needed for erectile dysfunction. (Patient not taking: Reported on 05/07/2022) 30 tablet 6   VITAMIN D PO Take by mouth daily at 2 PM. (Patient not taking: Reported on 05/07/2022)     No current facility-administered medications for this visit.     PHYSICAL EXAMINATION: ECOG PERFORMANCE STATUS: 0 - Asymptomatic Vitals:   05/07/22 1031  BP: 126/90  Pulse: 79  Resp: 18  Temp: (!) 97.1 F (36.2 C)   Filed Weights   05/07/22 1031  Weight: 268 lb (121.6 kg)    Physical Exam Constitutional:      General: He is not in acute distress. HENT:     Head: Normocephalic and atraumatic.  Eyes:     General: No scleral icterus. Cardiovascular:     Rate and Rhythm: Normal rate and regular rhythm.     Heart sounds: Normal heart sounds.  Pulmonary:     Effort: Pulmonary effort is normal. No respiratory distress.     Breath sounds: No wheezing.  Abdominal:     General: Bowel sounds are normal. There is no distension.     Palpations: Abdomen is soft.  Musculoskeletal:        General: No deformity. Normal range of motion.     Cervical back: Normal range of motion and neck supple.     Comments: Left finger status post nail removement.  Skin:    General: Skin is warm and dry.     Findings:  No erythema or rash.  Neurological:     Mental Status: He is alert and oriented to person, place, and time. Mental status is at baseline.     Cranial Nerves: No cranial nerve deficit.     Coordination: Coordination normal.  Psychiatric:        Mood and Affect: Mood normal.    LABORATORY DATA:  I have reviewed the data as listed Lab Results  Component Value Date   WBC 15.0 (H) 04/02/2022   HGB 15.9 04/02/2022   HCT 47.5 04/02/2022   MCV 84.2 04/02/2022   PLT 266 04/02/2022   Recent  Labs    06/25/21 1524 04/02/22 1535  NA 135 133*  K 3.8 3.6  CL 99 98  CO2 25 28  GLUCOSE 98 96  BUN 19 18  CREATININE 1.28* 1.33*  CALCIUM 9.6 9.5  GFRNONAA >60 >60  PROT 8.0 8.1  ALBUMIN 4.3 4.2  AST 18 15  ALT 21 19  ALKPHOS 88 84  BILITOT 0.4 0.5    Iron/TIBC/Ferritin/ %Sat No results found for: IRON, TIBC, FERRITIN, IRONPCTSAT    RADIOGRAPHIC STUDIES: I have personally reviewed the radiological images as listed and agreed with the findings in the report. No results found.    ASSESSMENT & PLAN:  1. Leukocytosis, unspecified type   2. Tobacco use    #Leukocytosis Previous work-up showed negative HIV,Negative peripheral blood flow cytometry, normal LDH, negative hepatitis panel, negative M protein on serum protein electrophoresis.  Additional work-up showed negative multiple myeloma panel, slightly elevated light chain ratio[nonspecific], LDH is normal.  BCR-ABL negative. Leukocytosis likely reactive.  Secondary to left index finger nail removal, as well as smoking.  Tobacco use, smoke cessation was discussed with patient.   Recurrent paronychia, follow-up with orthopedic surgeon  Orders Placed This Encounter  Procedures   CBC with Differential/Platelet    Standing Status:   Future    Standing Expiration Date:   05/08/2023   Comprehensive metabolic panel    Standing Status:   Future    Standing Expiration Date:   05/07/2023   Lactate dehydrogenase    Standing Status:    Future    Standing Expiration Date:   05/08/2023    All questions were answered. The patient knows to call the clinic with any problems questions or concerns.   Casilda Carls, MD    Return of visit: Follow-up in 6 months. Thank you for this kind referral and the opportunity to participate in the care of this patient. A copy of today's note is routed to referring provider    Earlie Server, MD, PhD Hematology Oncology  05/07/2022

## 2022-08-07 NOTE — Progress Notes (Signed)
   Post-Op Visit Note   Patient: William Cabrera           Date of Birth: 1981-11-11           MRN: [id number] Visit Date: 04/11/2022 PCP: Sherrie Mustache, MD   Assessment & Plan:  Chief Complaint:  Chief Complaint  Patient presents with   Left Index Finger - Follow-up, Wound Check    Doesn't feel like it is getting better as fast as it should    Visit Diagnoses:  1. Paronychia, finger, left     Plan: Patient is 5 weeks s/p I&D of left index finger paronychia.  No erythema.  No drainage.  Nail bed intact.  New nail plate not growing yet.  Will continue to monitor.   Follow-Up Instructions: No follow-ups on file.   Orders:  No orders of the defined types were placed in this encounter.  No orders of the defined types were placed in this encounter.   Imaging: No results found.  PMFS History: Patient Active Problem List   Diagnosis Date Noted   Paronychia, finger, left 03/06/2022   Past Medical History:  Diagnosis Date   Hyperlipemia    Hypertension     Family History  Problem Relation Age of Onset   Colon cancer Maternal Grandfather    Multiple sclerosis Mother    Hypertension Father    High Cholesterol Father    Breast cancer Maternal Aunt    Bladder Cancer Neg Hx    Kidney cancer Neg Hx    Prostate cancer Neg Hx     Past Surgical History:  Procedure Laterality Date   FINGER NAIL SURGERY     child   Social History   Occupational History   Not on file  Tobacco Use   Smoking status: Every Day    Packs/day: 1.00    Years: 24.00    Total pack years: 24.00    Types: Cigarettes   Smokeless tobacco: Former  Building services engineer Use: Never used  Substance and Sexual Activity   Alcohol use: Not Currently   Drug use: Not Currently    Types: Marijuana    Comment: Marijuana in the past    Sexual activity: Yes    Birth control/protection: None

## 2022-11-06 ENCOUNTER — Encounter: Payer: Self-pay | Admitting: Oncology

## 2022-11-06 ENCOUNTER — Inpatient Hospital Stay: Payer: Medicaid Other | Attending: Oncology

## 2022-11-06 ENCOUNTER — Inpatient Hospital Stay (HOSPITAL_BASED_OUTPATIENT_CLINIC_OR_DEPARTMENT_OTHER): Payer: Medicaid Other | Admitting: Oncology

## 2022-11-06 VITALS — BP 135/100 | HR 72 | Temp 96.0°F | Resp 18 | Wt 272.1 lb

## 2022-11-06 DIAGNOSIS — F17210 Nicotine dependence, cigarettes, uncomplicated: Secondary | ICD-10-CM | POA: Insufficient documentation

## 2022-11-06 DIAGNOSIS — D72829 Elevated white blood cell count, unspecified: Secondary | ICD-10-CM | POA: Insufficient documentation

## 2022-11-06 DIAGNOSIS — B351 Tinea unguium: Secondary | ICD-10-CM

## 2022-11-06 DIAGNOSIS — Z720 Tobacco use: Secondary | ICD-10-CM

## 2022-11-06 LAB — CBC WITH DIFFERENTIAL/PLATELET
Abs Immature Granulocytes: 0.05 10*3/uL (ref 0.00–0.07)
Basophils Absolute: 0.2 10*3/uL — ABNORMAL HIGH (ref 0.0–0.1)
Basophils Relative: 1 %
Eosinophils Absolute: 0.4 10*3/uL (ref 0.0–0.5)
Eosinophils Relative: 3 %
HCT: 46.9 % (ref 39.0–52.0)
Hemoglobin: 15.3 g/dL (ref 13.0–17.0)
Immature Granulocytes: 0 %
Lymphocytes Relative: 30 %
Lymphs Abs: 4.3 10*3/uL — ABNORMAL HIGH (ref 0.7–4.0)
MCH: 27.6 pg (ref 26.0–34.0)
MCHC: 32.6 g/dL (ref 30.0–36.0)
MCV: 84.7 fL (ref 80.0–100.0)
Monocytes Absolute: 0.7 10*3/uL (ref 0.1–1.0)
Monocytes Relative: 5 %
Neutro Abs: 8.7 10*3/uL — ABNORMAL HIGH (ref 1.7–7.7)
Neutrophils Relative %: 61 %
Platelets: 281 10*3/uL (ref 150–400)
RBC: 5.54 MIL/uL (ref 4.22–5.81)
RDW: 13.4 % (ref 11.5–15.5)
WBC: 14.4 10*3/uL — ABNORMAL HIGH (ref 4.0–10.5)
nRBC: 0 % (ref 0.0–0.2)

## 2022-11-06 LAB — COMPREHENSIVE METABOLIC PANEL
ALT: 19 U/L (ref 0–44)
AST: 15 U/L (ref 15–41)
Albumin: 4.2 g/dL (ref 3.5–5.0)
Alkaline Phosphatase: 87 U/L (ref 38–126)
Anion gap: 9 (ref 5–15)
BUN: 14 mg/dL (ref 6–20)
CO2: 25 mmol/L (ref 22–32)
Calcium: 9.4 mg/dL (ref 8.9–10.3)
Chloride: 102 mmol/L (ref 98–111)
Creatinine, Ser: 1.14 mg/dL (ref 0.61–1.24)
GFR, Estimated: >60 mL/min (ref >60)
Glucose, Bld: 101 mg/dL — ABNORMAL HIGH (ref 70–99)
Potassium: 4.2 mmol/L (ref 3.5–5.1)
Sodium: 136 mmol/L (ref 135–145)
Total Bilirubin: 0.5 mg/dL (ref 0.3–1.2)
Total Protein: 8.2 g/dL — ABNORMAL HIGH (ref 6.5–8.1)

## 2022-11-06 LAB — LACTATE DEHYDROGENASE: LDH: 90 U/L — ABNORMAL LOW (ref 98–192)

## 2022-11-06 NOTE — Progress Notes (Signed)
Pt here for follow up. Pt reports he has pain to right shoulder, feet and left index finger.

## 2022-11-06 NOTE — Assessment & Plan Note (Signed)
Recommend patient to see specialist for further management.

## 2022-11-06 NOTE — Progress Notes (Signed)
Hematology/Oncology Progress note [phone] [phone]      Patient Care Team: Casilda Carls, MD as PCP - General (Internal Medicine) Earlie Server, MD as Consulting Physician (Oncology)  ASSESSMENT & PLAN:   Leukocytosis #Leukocytosis Previous work-up showed negative HIV,Negative peripheral blood flow cytometry, normal LDH, negative hepatitis panel, negative M protein on serum protein electrophoresis.  Additional work-up showed negative multiple myeloma panel, slightly elevated light chain ratio[nonspecific], LDH is normal.  BCR-ABL negative. Leukocytosis likely reactive.  Tobacco use smoke cessation was discussed with patient  Onychomycosis Recommend patient to see specialist for further management.   Discharge. He may re-establish care in the future if clinically indicated.  All questions were answered. The patient knows to call the clinic with any problems, questions or concerns.  Earlie Server, MD, PhD [facility] Hematology Oncology 11/06/2022   CHIEF COMPLAINTS/REASON FOR VISIT:   leukocytosis  HISTORY OF PRESENTING ILLNESS:   William Cabrera is a  41 y.o.  male with PMH listed below was seen in consultation at the request of  Casilda Carls, MD  for evaluation of leukocytosis  Reviewed patient's blood work done recently at the primary care provider's office.  WBC 18.  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 Reports a history of right index finger infection for which he was put on 1 year of fluconazole. Denies any unintentional weight loss, fever, night sweat Mentioned that he had a boil on his butt which is infected intermittently.  Patient is a current everyday smoker.   Patient was seen by orthopedic surgeon on 02/17/2022 and was diagnosed with paronychia. Patient was seen by Dr. Sherilyn Cooter and had nail removed.  INTERVAL HISTORY William Cabrera is a 41 y.o. male who has above history reviewed by me today  presents for follow up visit for leukocytosis Patient had a blood work done and presents for follow-up.  Left index finger paronychia status post nail removal in the past, now his nail is thick and discolored. Some pain beneath nail plate. Denies weight loss, fever, chills, fatigue, night sweats.     Review of Systems  Constitutional:  Negative for appetite change, chills, fatigue, fever and unexpected weight change.  HENT:   Negative for hearing loss and voice change.   Eyes:  Negative for eye problems and icterus.  Respiratory:  Negative for chest tightness, cough and shortness of breath.   Cardiovascular:  Negative for chest pain and leg swelling.  Gastrointestinal:  Negative for abdominal distention and abdominal pain.  Endocrine: Negative for hot flashes.  Genitourinary:  Negative for difficulty urinating, dysuria and frequency.   Musculoskeletal:  Negative for arthralgias.  Skin:  Negative for itching and rash.  Neurological:  Negative for light-headedness and numbness.  Hematological:  Negative for adenopathy. Does not bruise/bleed easily.  Psychiatric/Behavioral:  Negative for confusion.     MEDICAL HISTORY:  Past Medical History:  Diagnosis Date   Hyperlipemia    Hypertension     SURGICAL HISTORY: Past Surgical History:  Procedure Laterality Date   FINGER NAIL SURGERY     child    SOCIAL HISTORY: Social History   Socioeconomic History   Marital status: Married    Spouse name: Not on file   Number of children: Not on file   Years of education: Not on file   Highest education level: Not on file  Occupational History   Not on file  Tobacco Use   Smoking status: Every Day  Packs/day: 1.00    Years: 24.00    Total pack years: 24.00    Types: Cigarettes   Smokeless tobacco: Former  Scientific laboratory technician Use: Never used  Substance and Sexual Activity   Alcohol use: Not Currently   Drug use: Not Currently    Types: Marijuana    Comment: Marijuana in the  past    Sexual activity: Yes    Birth control/protection: None  Other Topics Concern   Not on file  Social History Narrative   Not on file   Social Determinants of Health   Financial Resource Strain: Not on file  Food Insecurity: Not on file  Transportation Needs: Not on file  Physical Activity: Not on file  Stress: Not on file  Social Connections: Not on file  Intimate Partner Violence: Not on file    FAMILY HISTORY: Family History  Problem Relation Age of Onset   Colon cancer Maternal Grandfather    Multiple sclerosis Mother    Hypertension Father    High Cholesterol Father    Breast cancer Maternal Aunt    Bladder Cancer Neg Hx    Kidney cancer Neg Hx    Prostate cancer Neg Hx     ALLERGIES:  has No Known Allergies.  MEDICATIONS:  Current Outpatient Medications  Medication Sig Dispense Refill   atorvastatin (LIPITOR) 10 MG tablet Take 10 mg by mouth daily.     hydrochlorothiazide (HYDRODIURIL) 25 MG tablet Take 25 mg by mouth daily.     Losartan Potassium POWD Take by mouth.     tadalafil (CIALIS) 10 MG tablet Take 1-2 tablets (10-20 mg total) by mouth daily as needed for erectile dysfunction. 30 tablet 6   ibuprofen (ADVIL) 800 MG tablet Take 1 tablet (800 mg total) by mouth every 8 (eight) hours as needed. (Patient not taking: Reported on 05/07/2022) 21 tablet 0   oxyCODONE (OXY IR/ROXICODONE) 5 MG immediate release tablet Roxicodone 5 mg tablet  Take one tablet po q 4-6 hours prn pain. (Patient not taking: Reported on 11/06/2022)     No current facility-administered medications for this visit.     PHYSICAL EXAMINATION: ECOG PERFORMANCE STATUS: 0 - Asymptomatic Vitals:   11/06/22 1053  BP: (!) 135/100  Pulse: 72  Resp: 18  Temp: (!) 96 F (35.6 C)   Filed Weights   11/06/22 1053  Weight: 272 lb 1.6 oz (123.4 kg)    Physical Exam Constitutional:      General: He is not in acute distress. HENT:     Head: Normocephalic and atraumatic.  Eyes:      General: No scleral icterus. Cardiovascular:     Rate and Rhythm: Normal rate and regular rhythm.     Heart sounds: Normal heart sounds.  Pulmonary:     Effort: Pulmonary effort is normal. No respiratory distress.     Breath sounds: No wheezing.  Abdominal:     General: Bowel sounds are normal. There is no distension.     Palpations: Abdomen is soft.  Musculoskeletal:        General: No deformity. Normal range of motion.     Cervical back: Normal range of motion and neck supple.     Comments: Left index finger thick and discolored nail.  Skin:    General: Skin is warm and dry.     Findings: No erythema or rash.  Neurological:     Mental Status: He is alert and oriented to person, place, and time. Mental status  is at baseline.     Cranial Nerves: No cranial nerve deficit.     Coordination: Coordination normal.  Psychiatric:        Mood and Affect: Mood normal.     LABORATORY DATA:  I have reviewed the data as listed Lab Results  Component Value Date   WBC 14.4 (H) 11/06/2022   HGB 15.3 11/06/2022   HCT 46.9 11/06/2022   MCV 84.7 11/06/2022   PLT 281 11/06/2022   Recent Labs    04/02/22 1535 11/06/22 1030  NA 133* 136  K 3.6 4.2  CL 98 102  CO2 28 25  GLUCOSE 96 101*  BUN 18 14  CREATININE 1.33* 1.14  CALCIUM 9.5 9.4  GFRNONAA >60 >60  PROT 8.1 8.2*  ALBUMIN 4.2 4.2  AST 15 15  ALT 19 19  ALKPHOS 84 87  BILITOT 0.5 0.5    Iron/TIBC/Ferritin/ %Sat No results found for: "IRON", "TIBC", "FERRITIN", "IRONPCTSAT"    RADIOGRAPHIC STUDIES: I have personally reviewed the radiological images as listed and agreed with the findings in the report. No results found.

## 2022-11-06 NOTE — Assessment & Plan Note (Signed)
#  Leukocytosis Previous work-up showed negative HIV,Negative peripheral blood flow cytometry, normal LDH, negative hepatitis panel, negative M protein on serum protein electrophoresis.  Additional work-up showed negative multiple myeloma panel, slightly elevated light chain ratio[nonspecific], LDH is normal.  BCR-ABL negative. Leukocytosis likely reactive.

## 2022-11-06 NOTE — Assessment & Plan Note (Signed)
smoke cessation was discussed with patient. 

## 2023-10-06 DIAGNOSIS — E785 Hyperlipidemia, unspecified: Secondary | ICD-10-CM | POA: Diagnosis not present

## 2023-10-06 DIAGNOSIS — E559 Vitamin D deficiency, unspecified: Secondary | ICD-10-CM | POA: Diagnosis not present

## 2023-10-06 DIAGNOSIS — H6690 Otitis media, unspecified, unspecified ear: Secondary | ICD-10-CM | POA: Diagnosis not present

## 2023-10-06 DIAGNOSIS — R5383 Other fatigue: Secondary | ICD-10-CM | POA: Diagnosis not present

## 2023-10-06 DIAGNOSIS — E669 Obesity, unspecified: Secondary | ICD-10-CM | POA: Diagnosis not present

## 2023-10-06 DIAGNOSIS — D72829 Elevated white blood cell count, unspecified: Secondary | ICD-10-CM | POA: Diagnosis not present

## 2023-10-23 DIAGNOSIS — H9203 Otalgia, bilateral: Secondary | ICD-10-CM | POA: Diagnosis not present

## 2023-10-23 DIAGNOSIS — H6981 Other specified disorders of Eustachian tube, right ear: Secondary | ICD-10-CM | POA: Diagnosis not present

## 2023-10-23 DIAGNOSIS — J3489 Other specified disorders of nose and nasal sinuses: Secondary | ICD-10-CM | POA: Diagnosis not present

## 2024-01-14 DIAGNOSIS — E785 Hyperlipidemia, unspecified: Secondary | ICD-10-CM | POA: Diagnosis not present

## 2024-01-14 DIAGNOSIS — E559 Vitamin D deficiency, unspecified: Secondary | ICD-10-CM | POA: Diagnosis not present

## 2024-01-14 DIAGNOSIS — R5383 Other fatigue: Secondary | ICD-10-CM | POA: Diagnosis not present

## 2024-01-14 DIAGNOSIS — E669 Obesity, unspecified: Secondary | ICD-10-CM | POA: Diagnosis not present

## 2024-01-14 DIAGNOSIS — M109 Gout, unspecified: Secondary | ICD-10-CM | POA: Diagnosis not present

## 2024-01-19 DIAGNOSIS — R5383 Other fatigue: Secondary | ICD-10-CM | POA: Diagnosis not present

## 2024-01-19 DIAGNOSIS — M109 Gout, unspecified: Secondary | ICD-10-CM | POA: Diagnosis not present

## 2024-01-19 DIAGNOSIS — E785 Hyperlipidemia, unspecified: Secondary | ICD-10-CM | POA: Diagnosis not present

## 2024-01-19 DIAGNOSIS — E669 Obesity, unspecified: Secondary | ICD-10-CM | POA: Diagnosis not present

## 2024-01-19 DIAGNOSIS — I10 Essential (primary) hypertension: Secondary | ICD-10-CM | POA: Diagnosis not present

## 2024-01-19 DIAGNOSIS — E559 Vitamin D deficiency, unspecified: Secondary | ICD-10-CM | POA: Diagnosis not present

## 2024-01-25 DIAGNOSIS — E559 Vitamin D deficiency, unspecified: Secondary | ICD-10-CM | POA: Diagnosis not present

## 2024-01-25 DIAGNOSIS — E785 Hyperlipidemia, unspecified: Secondary | ICD-10-CM | POA: Diagnosis not present

## 2024-03-01 DIAGNOSIS — D72829 Elevated white blood cell count, unspecified: Secondary | ICD-10-CM | POA: Diagnosis not present

## 2024-03-01 DIAGNOSIS — E785 Hyperlipidemia, unspecified: Secondary | ICD-10-CM | POA: Diagnosis not present

## 2024-03-01 DIAGNOSIS — E669 Obesity, unspecified: Secondary | ICD-10-CM | POA: Diagnosis not present

## 2024-03-01 DIAGNOSIS — E559 Vitamin D deficiency, unspecified: Secondary | ICD-10-CM | POA: Diagnosis not present

## 2024-03-01 DIAGNOSIS — I10 Essential (primary) hypertension: Secondary | ICD-10-CM | POA: Diagnosis not present

## 2024-03-17 DIAGNOSIS — L089 Local infection of the skin and subcutaneous tissue, unspecified: Secondary | ICD-10-CM | POA: Diagnosis not present

## 2024-03-19 DIAGNOSIS — Z4800 Encounter for change or removal of nonsurgical wound dressing: Secondary | ICD-10-CM | POA: Diagnosis not present

## 2024-03-19 DIAGNOSIS — L089 Local infection of the skin and subcutaneous tissue, unspecified: Secondary | ICD-10-CM | POA: Diagnosis not present

## 2024-03-22 DIAGNOSIS — L089 Local infection of the skin and subcutaneous tissue, unspecified: Secondary | ICD-10-CM | POA: Diagnosis not present

## 2024-04-14 DIAGNOSIS — Z0000 Encounter for general adult medical examination without abnormal findings: Secondary | ICD-10-CM | POA: Diagnosis not present

## 2024-04-14 DIAGNOSIS — R718 Other abnormality of red blood cells: Secondary | ICD-10-CM | POA: Diagnosis not present

## 2024-04-14 DIAGNOSIS — Z125 Encounter for screening for malignant neoplasm of prostate: Secondary | ICD-10-CM | POA: Diagnosis not present

## 2024-04-14 DIAGNOSIS — E785 Hyperlipidemia, unspecified: Secondary | ICD-10-CM | POA: Diagnosis not present

## 2024-04-14 DIAGNOSIS — I10 Essential (primary) hypertension: Secondary | ICD-10-CM | POA: Diagnosis not present

## 2024-04-14 DIAGNOSIS — E669 Obesity, unspecified: Secondary | ICD-10-CM | POA: Diagnosis not present

## 2024-04-21 DIAGNOSIS — L089 Local infection of the skin and subcutaneous tissue, unspecified: Secondary | ICD-10-CM | POA: Diagnosis not present

## 2024-05-12 DIAGNOSIS — L089 Local infection of the skin and subcutaneous tissue, unspecified: Secondary | ICD-10-CM | POA: Diagnosis not present

## 2024-07-13 ENCOUNTER — Ambulatory Visit: Payer: Self-pay | Admitting: Family

## 2024-07-13 VITALS — BP 119/81 | Ht 67.0 in | Wt 258.0 lb

## 2024-07-13 DIAGNOSIS — E538 Deficiency of other specified B group vitamins: Secondary | ICD-10-CM

## 2024-07-13 DIAGNOSIS — E559 Vitamin D deficiency, unspecified: Secondary | ICD-10-CM | POA: Diagnosis not present

## 2024-07-13 DIAGNOSIS — R7303 Prediabetes: Secondary | ICD-10-CM | POA: Diagnosis not present

## 2024-07-13 DIAGNOSIS — R5383 Other fatigue: Secondary | ICD-10-CM

## 2024-07-13 DIAGNOSIS — E782 Mixed hyperlipidemia: Secondary | ICD-10-CM | POA: Diagnosis not present

## 2024-07-13 DIAGNOSIS — I10 Essential (primary) hypertension: Secondary | ICD-10-CM | POA: Diagnosis not present

## 2024-07-14 ENCOUNTER — Encounter: Payer: Self-pay | Admitting: Family

## 2024-07-14 LAB — HEMOGLOBIN A1C
Est. average glucose Bld gHb Est-mCnc: 123 mg/dL
Hgb A1c MFr Bld: 5.9 % — ABNORMAL HIGH (ref 4.8–5.6)

## 2024-07-14 LAB — CBC WITH DIFFERENTIAL/PLATELET
Basophils Absolute: 0.2 x10E3/uL (ref 0.0–0.2)
Basos: 1 %
EOS (ABSOLUTE): 0.5 x10E3/uL — ABNORMAL HIGH (ref 0.0–0.4)
Eos: 4 %
Hematocrit: 46.1 % (ref 37.5–51.0)
Hemoglobin: 14.6 g/dL (ref 13.0–17.7)
Immature Grans (Abs): 0 x10E3/uL (ref 0.0–0.1)
Immature Granulocytes: 0 %
Lymphocytes Absolute: 4.4 x10E3/uL — ABNORMAL HIGH (ref 0.7–3.1)
Lymphs: 32 %
MCH: 27.1 pg (ref 26.6–33.0)
MCHC: 31.7 g/dL (ref 31.5–35.7)
MCV: 86 fL (ref 79–97)
Monocytes Absolute: 0.9 x10E3/uL (ref 0.1–0.9)
Monocytes: 7 %
Neutrophils Absolute: 7.7 x10E3/uL — ABNORMAL HIGH (ref 1.4–7.0)
Neutrophils: 56 %
Platelets: 286 x10E3/uL (ref 150–450)
RBC: 5.38 x10E6/uL (ref 4.14–5.80)
RDW: 13.3 % (ref 11.6–15.4)
WBC: 13.7 x10E3/uL — ABNORMAL HIGH (ref 3.4–10.8)

## 2024-07-14 LAB — LIPID PANEL
Chol/HDL Ratio: 4.1 ratio (ref 0.0–5.0)
Cholesterol, Total: 184 mg/dL (ref 100–199)
HDL: 45 mg/dL (ref >39)
LDL Chol Calc (NIH): 111 mg/dL — ABNORMAL HIGH (ref 0–99)
Triglycerides: 159 mg/dL — ABNORMAL HIGH (ref 0–149)
VLDL Cholesterol Cal: 28 mg/dL (ref 5–40)

## 2024-07-14 LAB — CMP14+EGFR
ALT: 13 IU/L (ref 0–44)
AST: 13 IU/L (ref 0–40)
Albumin: 4.4 g/dL (ref 4.1–5.1)
Alkaline Phosphatase: 106 IU/L (ref 44–121)
BUN/Creatinine Ratio: 11 (ref 9–20)
BUN: 14 mg/dL (ref 6–24)
Bilirubin Total: 0.3 mg/dL (ref 0.0–1.2)
CO2: 25 mmol/L (ref 20–29)
Calcium: 9.9 mg/dL (ref 8.7–10.2)
Chloride: 100 mmol/L (ref 96–106)
Creatinine, Ser: 1.23 mg/dL (ref 0.76–1.27)
Globulin, Total: 2.9 g/dL (ref 1.5–4.5)
Glucose: 79 mg/dL (ref 70–99)
Potassium: 4.5 mmol/L (ref 3.5–5.2)
Sodium: 137 mmol/L (ref 134–144)
Total Protein: 7.3 g/dL (ref 6.0–8.5)
eGFR: 75 mL/min/1.73 (ref >59)

## 2024-07-14 LAB — VITAMIN B12: Vitamin B-12: 456 pg/mL (ref 232–1245)

## 2024-07-14 LAB — TSH: TSH: 1.97 u[IU]/mL (ref 0.450–4.500)

## 2024-07-14 LAB — VITAMIN D 25 HYDROXY (VIT D DEFICIENCY, FRACTURES): Vit D, 25-Hydroxy: 49.1 ng/mL (ref 30.0–100.0)

## 2024-07-14 NOTE — Progress Notes (Signed)
 New Patient Office Visit  Subjective  Patient ID: William Cabrera, male    DOB: 10/13/1981  Age: 43 y.o. MRN: [id number]  CC:  Chief Complaint  Patient presents with   Establish Care    HPI William Cabrera presents to establish care Previous Primary Care provider/office:   he does have additional concerns to discuss today.   Patient is here today to establish care.   He previously saw Dr. Jadali. He is mostly concerned because he would like to be checked for diabetes as he says he frequently has numbness and tingling in his hands and feet. He does have a strong Family, history of diabetes, and his mother suggested that he get checked.  Last labs were done in February. No other major concerns at this time.    Outpatient Encounter Medications as of 07/13/2024  Medication Sig   atorvastatin (LIPITOR) 10 MG tablet Take 10 mg by mouth daily.   hydrochlorothiazide (HYDRODIURIL) 25 MG tablet Take 25 mg by mouth daily. (Patient not taking: Reported on 07/13/2024)   ibuprofen  (ADVIL ) 800 MG tablet Take 1 tablet (800 mg total) by mouth every 8 (eight) hours as needed. (Patient not taking: Reported on 07/13/2024)   Losartan Potassium POWD Take by mouth. (Patient not taking: Reported on 07/13/2024)   oxyCODONE  (OXY IR/ROXICODONE ) 5 MG immediate release tablet Roxicodone  5 mg tablet  Take one tablet po q 4-6 hours prn pain. (Patient not taking: Reported on 07/13/2024)   tadalafil  (CIALIS ) 10 MG tablet Take 1-2 tablets (10-20 mg total) by mouth daily as needed for erectile dysfunction. (Patient not taking: Reported on 07/13/2024)   No facility-administered encounter medications on file as of 07/13/2024.    Past Medical History:  Diagnosis Date   Hyperlipemia    Hypertension     Past Surgical History:  Procedure Laterality Date   FINGER NAIL SURGERY     child    Family History  Problem Relation Age of Onset   Colon cancer Maternal Grandfather    Multiple sclerosis Mother    Hypertension  Father    High Cholesterol Father    Breast cancer Maternal Aunt    Bladder Cancer Neg Hx    Kidney cancer Neg Hx    Prostate cancer Neg Hx     Social History   Socioeconomic History   Marital status: Married    Spouse name: Not on file   Number of children: Not on file   Years of education: Not on file   Highest education level: Not on file  Occupational History   Not on file  Tobacco Use   Smoking status: Every Day    Current packs/day: 1.00    Average packs/day: 1 pack/day for 24.0 years (24.0 ttl pk-yrs)    Types: Cigarettes   Smokeless tobacco: Former  Building services engineer status: Never Used  Substance and Sexual Activity   Alcohol use: Not Currently   Drug use: Not Currently    Types: Marijuana    Comment: Marijuana in the past    Sexual activity: Yes    Birth control/protection: None  Other Topics Concern   Not on file  Social History Narrative   Not on file   Social Drivers of Health   Financial Resource Strain: Not on file  Food Insecurity: Not on file  Transportation Needs: Not on file  Physical Activity: Not on file  Stress: Not on file  Social Connections: Not on file  Intimate Partner Violence: Not on file  Review of Systems  All other systems reviewed and are negative.       Objective   BP 119/81   Ht 5' 7 (1.702 m)   Wt 258 lb (117 kg)   BMI 40.41 kg/m   Physical Exam Vitals and nursing note reviewed.  Constitutional:      Appearance: Normal appearance. He is normal weight.  Eyes:     Pupils: Pupils are equal, round, and reactive to light.  Cardiovascular:     Rate and Rhythm: Normal rate and regular rhythm.     Pulses: Normal pulses.     Heart sounds: Normal heart sounds.  Pulmonary:     Effort: Pulmonary effort is normal.     Breath sounds: Normal breath sounds.  Musculoskeletal:        General: Normal range of motion.     Cervical back: Normal range of motion.  Neurological:     Mental Status: He is alert.   Psychiatric:        Mood and Affect: Mood normal.        Behavior: Behavior normal.       Assessment & Plan Essential hypertension, benign Blood pressure well controlled with current medications.  Continue current therapy.  Will reassess at follow up.   - CBC w/Diff - CMP w/eGFR  Vitamin D  deficiency, unspecified B12 deficiency due to diet Other fatigue Checking labs today.  Will continue supplements as needed.   - Vitamin D  - Vitamin B12 - TSH  Mixed hyperlipidemia Checking labs today.  Continue current therapy for lipid control. Will modify as needed based on labwork results.   -CMP w/eGFR -Lipid Panel  Prediabetes A1C Continues to be in prediabetic ranges.  Will reassess at follow up after next lab check.  Patient counseled on dietary choices and verbalized understanding.   -CBC w/Diff -CMP w/eGFR -Hemoglobin A1C   Return in about 1 month (around 08/13/2024).   Total time spent: 30 minutes  ALAN CHRISTELLA ARRANT, FNP  07/13/2024  This document may have been prepared by [facility] Voice Recognition software and as such may include unintentional dictation errors.

## 2024-07-15 ENCOUNTER — Ambulatory Visit: Payer: Self-pay | Admitting: Family

## 2024-07-22 ENCOUNTER — Ambulatory Visit: Admission: EM | Admit: 2024-07-22 | Discharge: 2024-07-22 | Disposition: A | Discharge status: Home / Self Care

## 2024-07-22 DIAGNOSIS — L02511 Cutaneous abscess of right hand: Secondary | ICD-10-CM | POA: Diagnosis not present

## 2024-07-22 MED ORDER — DOXYCYCLINE HYCLATE 100 MG PO CAPS: 100.0000 mg | ORAL_CAPSULE | Freq: Two times a day (BID) | ORAL | 0 refills | Status: DC

## 2024-07-22 NOTE — ED Notes (Signed)
 Patient reports that his PCP was unable to obtain SPO2. Patient reported he has thick nails bed. Spo2 was attempted on fingers and toes unable to obtain. RONAL Pizza, NP informed also.

## 2024-07-22 NOTE — ED Provider Notes (Signed)
 William Cabrera    CSN: [id number] Arrival date & time: 07/22/24  1749      History   Chief Complaint Chief Complaint  Patient presents with   Finger Injury    HPI William Cabrera is a 43 y.o. male.   Patient presents for evaluation of pain and swelling present to the right ring finger beginning 4 days ago.   poked a hole into the area 1 day ago with a clean razor and now has begun to see [name] drainage.  Has had infections to the finger in the past.  Has full range of motion.  Denies numbness or tingling.  Denies fever.  Past Medical History:  Diagnosis Date   Hyperlipemia    Hypertension     Patient Active Problem List   Diagnosis Date Noted   Leukocytosis 11/06/2022   Tobacco use 11/06/2022   Onychomycosis 11/06/2022   Paronychia, finger, left 03/06/2022    Past Surgical History:  Procedure Laterality Date   FINGER NAIL SURGERY     child       Home Medications    Prior to Admission medications   Medication Sig Start Date End Date Taking? Authorizing Provider  Cholecalciferol (VITAMIN D-3 PO) Take 1 tablet by mouth once a week.   Yes [provider]  doxycycline (VIBRAMYCIN) 100 MG capsule Take 1 capsule (100 mg total) by mouth 2 (two) times daily. 07/22/24  Yes Dominque Levandowski R, NP  atorvastatin (LIPITOR) 10 MG tablet Take 10 mg by mouth daily.    [provider]  hydrochlorothiazide (HYDRODIURIL) 25 MG tablet Take 25 mg by mouth daily. Patient not taking: Reported on 07/13/2024    [provider]  ibuprofen (ADVIL) 800 MG tablet Take 1 tablet (800 mg total) by mouth every 8 (eight) hours as needed. Patient not taking: Reported on 07/13/2024 01/26/22   Arloa Suzen RAMAN, NP  Losartan Potassium POWD Take by mouth. Patient not taking: Reported on 07/13/2024    [provider]  oxyCODONE (OXY IR/ROXICODONE) 5 MG immediate release tablet Roxicodone 5 mg tablet  Take one tablet po q 4-6 hours prn pain. Patient not taking:  Reported on 07/13/2024    [provider]  tadalafil (CIALIS) 10 MG tablet Take 1-2 tablets (10-20 mg total) by mouth daily as needed for erectile dysfunction. Patient not taking: Reported on 07/13/2024 12/19/20   Francisca Redell BROCKS, MD    Family History Family History  Problem Relation Age of Onset   Colon cancer Maternal Grandfather    Multiple sclerosis Mother    Hypertension Father    High Cholesterol Father    Breast cancer Maternal Aunt    Bladder Cancer Neg Hx    Kidney cancer Neg Hx    Prostate cancer Neg Hx     Social History Social History   Tobacco Use   Smoking status: Every Day    Current packs/day: 1.00    Average packs/day: 1 pack/day for 24.0 years (24.0 ttl pk-yrs)    Types: Cigarettes   Smokeless tobacco: Former  Building services engineer status: Never Used  Substance Use Topics   Alcohol use: Not Currently   Drug use: Not Currently    Types: Marijuana    Comment: Marijuana in the past      Allergies   Patient has no known allergies.   Review of Systems Review of Systems   Physical Exam Triage Vital Signs ED Triage Vitals  Encounter Vitals Group  BP 07/22/24 1814 (!) 145/86     Girls Systolic BP Percentile --      Girls Diastolic BP Percentile --      Boys Systolic BP Percentile --      Boys Diastolic BP Percentile --      Pulse Rate 07/22/24 1814 87     Resp 07/22/24 1814 18     Temp 07/22/24 1814 98.6 F (37 C)     Temp Source 07/22/24 1814 Oral     SpO2 --      Weight --      Height --      Head Circumference --      Peak Flow --      Pain Score 07/22/24 1808 6     Pain Loc --      Pain Education --      Exclude from Growth Chart --    No data found.  Updated Vital Signs BP (!) 145/86 (BP Location: Left Arm)   Pulse 87   Temp 98.6 F (37 C) (Oral)   Resp 18   Visual Acuity Right Eye Distance:   Left Eye Distance:   Bilateral Distance:    Right Eye Near:   Left Eye Near:    Bilateral Near:     Physical  Exam Constitutional:      Appearance: Normal appearance.  Eyes:     Extraocular Movements: Extraocular movements intact.  Pulmonary:     Effort: Pulmonary effort is normal.  Skin:    Comments: Defer to photo   Neurological:     Mental Status: He is alert and oriented to person, place, and time.      UC Treatments / Results  Labs (all labs ordered are listed, but only abnormal results are displayed) Labs Reviewed - No data to display  EKG   Radiology No results found.  Procedures Procedures (including critical care time)  Medications Ordered in UC Medications - No data to display  Initial Impression / Assessment and Plan / UC Course  I have reviewed the triage vital signs and the nursing notes.  Pertinent labs & imaging results that were available during my care of the patient were reviewed by me and considered in my medical decision making (see chart for details).  Abscess of the right middle finger  Concerning for infection, purulent drainage noted on exam, prescribed doxycycline  and discussed administration recommended warm compresses to the affected area and advised follow-up for nonhealing nondraining site Final Clinical Impressions(s) / UC Diagnoses   Final diagnoses:  Abscess of right middle finger     Discharge Instructions      Take doxycycline  twice daily for 10 days  Hold warm-hot compresses to affected area at least 4 times a day, this helps to facilitate draining, the more the better  Please return for evaluation for increased swelling, increased tenderness or pain, non healing site, non draining site, you begin to have fever or chills   We reviewed the etiology of recurrent abscesses of skin.  Skin abscesses are collections of pus within the dermis and deeper skin tissues. Skin abscesses manifest as painful, tender, fluctuant, and erythematous nodules, frequently surmounted by a pustule and surrounded by a rim of erythematous swelling.  Spontaneous  drainage of purulent material may occur.  Fever can occur on occasion.    -Skin abscesses can develop in healthy individuals with no predisposing conditions other than skin or nasal carriage of Staphylococcus aureus.  Individuals in close contact with others  who have active infection with skin abscesses are at increased risk which is likely to explain why twin brother has similar episodes.   In addition, any process leading to a breach in the skin barrier can also predispose to the development of a skin abscesses, such as atopic dermatitis.      ED Prescriptions     Medication Sig Dispense Auth. Provider   doxycycline (VIBRAMYCIN) 100 MG capsule Take 1 capsule (100 mg total) by mouth 2 (two) times daily. 20 capsule Taevon Aschoff R, NP      PDMP not reviewed this encounter.   Teresa Shelba SAUNDERS, NP 07/22/24 [phone]

## 2024-07-22 NOTE — Discharge Instructions (Addendum)
 Take doxycycline twice daily for 10 days  Hold warm-hot compresses to affected area at least 4 times a day, this helps to facilitate draining, the more the better  Please return for evaluation for increased swelling, increased tenderness or pain, non healing site, non draining site, you begin to have fever or chills   We reviewed the etiology of recurrent abscesses of skin.  Skin abscesses are collections of pus within the dermis and deeper skin tissues. Skin abscesses manifest as painful, tender, fluctuant, and erythematous nodules, frequently surmounted by a pustule and surrounded by a rim of erythematous swelling.  Spontaneous drainage of purulent material may occur.  Fever can occur on occasion.    -Skin abscesses can develop in healthy individuals with no predisposing conditions other than skin or nasal carriage of Staphylococcus aureus.  Individuals in close contact with others who have active infection with skin abscesses are at increased risk which is likely to explain why twin brother has similar episodes.   In addition, any process leading to a breach in the skin barrier can also predispose to the development of a skin abscesses, such as atopic dermatitis.

## 2024-07-22 NOTE — ED Triage Notes (Signed)
 Patient complains pain, swelling to right ring finger x 4 days. Patient open area up with a clean new razor and now has  has white drainage.

## 2024-08-02 ENCOUNTER — Ambulatory Visit: Payer: Self-pay

## 2024-08-19 ENCOUNTER — Ambulatory Visit: Admitting: Family

## 2024-08-19 ENCOUNTER — Encounter: Payer: Self-pay | Admitting: Family

## 2024-08-19 VITALS — BP 132/90 | Ht 67.0 in | Wt 263.8 lb

## 2024-08-19 DIAGNOSIS — E538 Deficiency of other specified B group vitamins: Secondary | ICD-10-CM | POA: Diagnosis not present

## 2024-08-19 DIAGNOSIS — E559 Vitamin D deficiency, unspecified: Secondary | ICD-10-CM | POA: Diagnosis not present

## 2024-08-19 DIAGNOSIS — I10 Essential (primary) hypertension: Secondary | ICD-10-CM

## 2024-08-19 DIAGNOSIS — R7303 Prediabetes: Secondary | ICD-10-CM | POA: Diagnosis not present

## 2024-08-19 DIAGNOSIS — E782 Mixed hyperlipidemia: Secondary | ICD-10-CM | POA: Diagnosis not present

## 2024-08-20 ENCOUNTER — Encounter: Payer: Self-pay | Admitting: Family

## 2024-08-20 DIAGNOSIS — I10 Essential (primary) hypertension: Secondary | ICD-10-CM | POA: Insufficient documentation

## 2024-08-20 DIAGNOSIS — E538 Deficiency of other specified B group vitamins: Secondary | ICD-10-CM | POA: Insufficient documentation

## 2024-08-20 DIAGNOSIS — R7303 Prediabetes: Secondary | ICD-10-CM | POA: Insufficient documentation

## 2024-08-20 MED ORDER — LOSARTAN POTASSIUM 50 MG PO TABS: 50.0000 mg | ORAL_TABLET | Freq: Every day | ORAL | 2 refills | Status: DC

## 2024-08-20 NOTE — Progress Notes (Signed)
 Established Patient Office Visit  Subjective:  Patient ID: William Cabrera, male    DOB: 12-24-1980  Age: 43 y.o. MRN: [id number]  Chief Complaint  Patient presents with   Follow-up    1 month follow up    Patient is here today for his 1 month follow up.  He has been feeling well since last appointment.   He does not have additional concerns to discuss today.  Labs were done prior to appointment, will discuss in detail today.  Labs overall  He needs refills.   I have reviewed his active problem list, medication list, allergies, notes from last encounter, lab results for his appointment today.      No other concerns at this time.   Past Medical History:  Diagnosis Date   Hyperlipemia    Hypertension     Past Surgical History:  Procedure Laterality Date   FINGER NAIL SURGERY     child    Social History   Socioeconomic History   Marital status: Married    Spouse name: Not on file   Number of children: Not on file   Years of education: Not on file   Highest education level: Not on file  Occupational History   Not on file  Tobacco Use   Smoking status: Every Day    Current packs/day: 1.00    Average packs/day: 1 pack/day for 24.0 years (24.0 ttl pk-yrs)    Types: Cigarettes   Smokeless tobacco: Former  Building services engineer status: Never Used  Substance and Sexual Activity   Alcohol use: Not Currently   Drug use: Not Currently    Types: Marijuana    Comment: Marijuana in the past    Sexual activity: Yes    Birth control/protection: None  Other Topics Concern   Not on file  Social History Narrative   Not on file   Social Drivers of Health   Financial Resource Strain: Not on file  Food Insecurity: Not on file  Transportation Needs: Not on file  Physical Activity: Not on file  Stress: Not on file  Social Connections: Not on file  Intimate Partner Violence: Not on file    Family History  Problem Relation Age of Onset   Colon cancer Maternal  Grandfather    Multiple sclerosis Mother    Hypertension Father    High Cholesterol Father    Breast cancer Maternal Aunt    Bladder Cancer Neg Hx    Kidney cancer Neg Hx    Prostate cancer Neg Hx     No Known Allergies  Review of Systems  All other systems reviewed and are negative.      Objective:   BP (!) 132/90   Ht 5' 7 (1.702 m)   Wt 263 lb 12.8 oz (119.7 kg)   BMI 41.32 kg/m   Vitals:   08/19/24 1534  BP: (!) 132/90  Height: 5' 7 (1.702 m)  Weight: 263 lb 12.8 oz (119.7 kg)  BMI (Calculated): 41.31    Physical Exam Vitals and nursing note reviewed.  Constitutional:      Appearance: Normal appearance. He is normal weight.  Eyes:     Pupils: Pupils are equal, round, and reactive to light.  Cardiovascular:     Rate and Rhythm: Normal rate and regular rhythm.     Pulses: Normal pulses.     Heart sounds: Normal heart sounds.  Pulmonary:     Effort: Pulmonary effort is normal.  Breath sounds: Normal breath sounds.  Neurological:     Mental Status: He is alert.  Psychiatric:        Mood and Affect: Mood normal.        Behavior: Behavior normal.      No results found for any visits on 08/19/24.  Recent Results (from the past 2160 hours)  Lipid panel     Status: Abnormal   Collection Time: 07/13/24  2:46 PM  Result Value Ref Range   Cholesterol, Total 184 100 - 199 mg/dL   Triglycerides 840 (H) 0 - 149 mg/dL   HDL 45 >60 mg/dL   VLDL Cholesterol Cal 28 5 - 40 mg/dL   LDL Chol Calc (NIH) 888 (H) 0 - 99 mg/dL   Chol/HDL Ratio 4.1 0.0 - 5.0 ratio    Comment:                                   T. Chol/HDL Ratio                                             Men  Women                               1/2 Avg.Risk  3.4    3.3                                   Avg.Risk  5.0    4.4                                2X Avg.Risk  9.6    7.1                                3X Avg.Risk 23.4   11.0   VITAMIN D  25 Hydroxy (Vit-D Deficiency, Fractures)     Status:  None   Collection Time: 07/13/24  2:46 PM  Result Value Ref Range   Vit D, 25-Hydroxy 49.1 30.0 - 100.0 ng/mL    Comment: Vitamin D  deficiency has been defined by the Institute of Medicine and an Endocrine Society practice guideline as a level of serum 25-OH vitamin D  less than 20 ng/mL (1,2). The Endocrine Society went on to further define vitamin D  insufficiency as a level between 21 and 29 ng/mL (2). 1. IOM (Institute of Medicine). 2010. Dietary reference    intakes for calcium and D. [place]: The    [employer]. 2. Holick MF, Binkley [place], Bischoff-Ferrari HA, et al.    Evaluation, treatment, and prevention of vitamin D     deficiency: an Endocrine Society clinical practice    guideline. JCEM. 2011 Jul; 96(7):1911-30.   CMP14+EGFR     Status: None   Collection Time: 07/13/24  2:46 PM  Result Value Ref Range   Glucose 79 70 - 99 mg/dL   BUN 14 6 - 24 mg/dL   Creatinine, Ser 8.76 0.76 - 1.27 mg/dL   eGFR 75 >40 fO/fpw/8.26   BUN/Creatinine Ratio 11 9 - 20   Sodium 137  134 - 144 mmol/L   Potassium 4.5 3.5 - 5.2 mmol/L   Chloride 100 96 - 106 mmol/L   CO2 25 20 - 29 mmol/L   Calcium 9.9 8.7 - 10.2 mg/dL   Total Protein 7.3 6.0 - 8.5 g/dL   Albumin 4.4 4.1 - 5.1 g/dL   Globulin, Total 2.9 1.5 - 4.5 g/dL   Bilirubin Total 0.3 0.0 - 1.2 mg/dL   Alkaline Phosphatase 106 44 - 121 IU/L   AST 13 0 - 40 IU/L   ALT 13 0 - 44 IU/L  TSH     Status: None   Collection Time: 07/13/24  2:46 PM  Result Value Ref Range   TSH 1.970 0.450 - 4.500 uIU/mL  Hemoglobin A1c     Status: Abnormal   Collection Time: 07/13/24  2:46 PM  Result Value Ref Range   Hgb A1c MFr Bld 5.9 (H) 4.8 - 5.6 %    Comment:          Prediabetes: 5.7 - 6.4          Diabetes: >6.4          Glycemic control for adults with diabetes: <7.0    Est. average glucose Bld gHb Est-mCnc 123 mg/dL  Vitamin B12     Status: None   Collection Time: 07/13/24  2:46 PM  Result Value Ref Range   Vitamin B-12 456  232 - 1,245 pg/mL  CBC with Diff     Status: Abnormal   Collection Time: 07/13/24  2:46 PM  Result Value Ref Range   WBC 13.7 (H) 3.4 - 10.8 x10E3/uL   RBC 5.38 4.14 - 5.80 x10E6/uL   Hemoglobin 14.6 13.0 - 17.7 g/dL   Hematocrit 53.8 62.4 - 51.0 %   MCV 86 79 - 97 fL   MCH 27.1 26.6 - 33.0 pg   MCHC 31.7 31.5 - 35.7 g/dL   RDW 86.6 88.3 - 84.5 %   Platelets 286 150 - 450 x10E3/uL   Neutrophils 56 Not Estab. %   Lymphs 32 Not Estab. %   Monocytes 7 Not Estab. %   Eos 4 Not Estab. %   Basos 1 Not Estab. %   Neutrophils Absolute 7.7 (H) 1.4 - 7.0 x10E3/uL   Lymphocytes Absolute 4.4 (H) 0.7 - 3.1 x10E3/uL   Monocytes Absolute 0.9 0.1 - 0.9 x10E3/uL   EOS (ABSOLUTE) 0.5 (H) 0.0 - 0.4 x10E3/uL   Basophils Absolute 0.2 0.0 - 0.2 x10E3/uL   Immature Granulocytes 0 Not Estab. %   Immature Grans (Abs) 0.0 0.0 - 0.1 x10E3/uL       Assessment & Plan Essential hypertension, benign Adding losartan  to meds.  Will recheck blood pressure at follow up.   Vitamin D  deficiency, unspecified B12 deficiency due to diet Checking labs today.  Will continue supplements as needed.   - Vitamin D  - Vitamin B12 - TSH  Prediabetes A1C Continues to be in prediabetic ranges.  Will reassess at follow up after next lab check.  Patient counseled on dietary choices and verbalized understanding.   -CBC w/Diff -CMP w/eGFR -Hemoglobin A1C  Mixed hyperlipidemia Checking labs today.  Continue current therapy for lipid control. Will modify as needed based on labwork results.   -CMP w/eGFR -Lipid Panel     Return in about 3 months (around 11/18/2024).   Total time spent: 20 minutes  ALAN CHRISTELLA ARRANT, FNP  08/19/2024   This document may have been prepared by [employer] Voice Recognition software and as  such may include unintentional dictation errors.

## 2024-08-20 NOTE — Assessment & Plan Note (Signed)
 Checking labs today.  Will continue supplements as needed.   - Vitamin D  - Vitamin B12 - TSH

## 2024-08-20 NOTE — Assessment & Plan Note (Signed)
.  A1C Continues to be in prediabetic ranges.  Will reassess at follow up after next lab check.  Patient counseled on dietary choices and verbalized understanding.   -CBC w/Diff -CMP w/eGFR -Hemoglobin A1C

## 2024-08-20 NOTE — Assessment & Plan Note (Signed)
 Adding losartan  to meds.  Will recheck blood pressure at follow up.

## 2024-09-13 ENCOUNTER — Other Ambulatory Visit: Payer: Self-pay | Admitting: Family

## 2024-09-14 ENCOUNTER — Other Ambulatory Visit: Payer: Self-pay

## 2024-10-10 ENCOUNTER — Other Ambulatory Visit: Payer: Self-pay | Admitting: Family

## 2024-10-10 ENCOUNTER — Ambulatory Visit (INDEPENDENT_AMBULATORY_CARE_PROVIDER_SITE_OTHER): Payer: Self-pay | Admitting: Family

## 2024-10-10 ENCOUNTER — Encounter: Payer: Self-pay | Admitting: Family

## 2024-10-10 VITALS — BP 160/100 | Ht 67.0 in | Wt 261.0 lb

## 2024-10-10 DIAGNOSIS — L03019 Cellulitis of unspecified finger: Secondary | ICD-10-CM

## 2024-10-10 DIAGNOSIS — I10 Essential (primary) hypertension: Secondary | ICD-10-CM

## 2024-10-10 DIAGNOSIS — R7303 Prediabetes: Secondary | ICD-10-CM

## 2024-10-10 MED ORDER — DOXYCYCLINE HYCLATE 100 MG PO CAPS: 100.0000 mg | ORAL_CAPSULE | Freq: Two times a day (BID) | ORAL | 0 refills | Status: AC

## 2024-10-11 NOTE — Progress Notes (Unsigned)
 Acute Office Visit  Subjective:     Patient ID: William Cabrera, male    DOB: 05-22-1981, 43 y.o.   MRN: [id number]  Patient is in today for  Chief Complaint  Patient presents with   Hand Pain    Right middle finger pain x 1 month    Presents with a swollen finger. Reports a small cut on the finger about a week ago. Pus was noted on the tip, which was picked and drained, but the area has not improved. Denies the area is warm to the touch. Reports a history of similar issues with other fingers, requiring nail removal on three previous occasions over the last four to five years. The current issue would be the fourth. Reports a previous finger had a nail removed but not lanced, resulting in abnormal nail regrowth and some soreness. Reports a history of skin tearing easily with weather changes. Reports variable hand temperature, sometimes cold, sometimes hot. Discussed a previous visit where circulation issues were raised.  He also says that after previous appt he did not ever pick up the losartan , so his blood pressure today is without starting the medications.   No other concerns today.      Review of Systems  Skin:        Right second fingertip swollen and painful  All other systems reviewed and are negative.       Objective:    BP (!) 160/100   Ht 5' 7 (1.702 m)   Wt 261 lb (118.4 kg)   BMI 40.88 kg/m   Physical Exam Vitals and nursing note reviewed.  Constitutional:      Appearance: Normal appearance. He is normal weight.  Eyes:     Pupils: Pupils are equal, round, and reactive to light.  Cardiovascular:     Rate and Rhythm: Normal rate and regular rhythm.     Pulses: Normal pulses.     Heart sounds: Normal heart sounds.  Pulmonary:     Effort: Pulmonary effort is normal.     Breath sounds: Normal breath sounds.  Musculoskeletal:     Right hand: Swelling: right middle finger tip.  Neurological:     Mental Status: He is alert.  Psychiatric:        Mood and  Affect: Mood normal.        Behavior: Behavior normal.     No results found for any visits on 10/10/24.  Recent Results (from the past 2160 hours)  Lipid panel     Status: Abnormal   Collection Time: 07/13/24  2:46 PM  Result Value Ref Range   Cholesterol, Total 184 100 - 199 mg/dL   Triglycerides 840 (H) 0 - 149 mg/dL   HDL 45 >60 mg/dL   VLDL Cholesterol Cal 28 5 - 40 mg/dL   LDL Chol Calc (NIH) 888 (H) 0 - 99 mg/dL   Chol/HDL Ratio 4.1 0.0 - 5.0 ratio    Comment:                                   T. Chol/HDL Ratio                                             Men  Women  1/2 Avg.Risk  3.4    3.3                                   Avg.Risk  5.0    4.4                                2X Avg.Risk  9.6    7.1                                3X Avg.Risk 23.4   11.0   VITAMIN D  25 Hydroxy (Vit-D Deficiency, Fractures)     Status: None   Collection Time: 07/13/24  2:46 PM  Result Value Ref Range   Vit D, 25-Hydroxy 49.1 30.0 - 100.0 ng/mL    Comment: Vitamin D  deficiency has been defined by the Institute of Medicine and an Endocrine Society practice guideline as a level of serum 25-OH vitamin D  less than 20 ng/mL (1,2). The Endocrine Society went on to further define vitamin D  insufficiency as a level between 21 and 29 ng/mL (2). 1. IOM (Institute of Medicine). 2010. Dietary reference    intakes for calcium and D. [place]: The    [employer]. 2. Holick MF, Binkley [place], Bischoff-Ferrari HA, et al.    Evaluation, treatment, and prevention of vitamin D     deficiency: an Endocrine Society clinical practice    guideline. JCEM. 2011 Jul; 96(7):1911-30.   CMP14+EGFR     Status: None   Collection Time: 07/13/24  2:46 PM  Result Value Ref Range   Glucose 79 70 - 99 mg/dL   BUN 14 6 - 24 mg/dL   Creatinine, Ser 8.76 0.76 - 1.27 mg/dL   eGFR 75 >40 fO/fpw/8.26   BUN/Creatinine Ratio 11 9 - 20   Sodium 137 134 - 144 mmol/L   Potassium 4.5  3.5 - 5.2 mmol/L   Chloride 100 96 - 106 mmol/L   CO2 25 20 - 29 mmol/L   Calcium 9.9 8.7 - 10.2 mg/dL   Total Protein 7.3 6.0 - 8.5 g/dL   Albumin 4.4 4.1 - 5.1 g/dL   Globulin, Total 2.9 1.5 - 4.5 g/dL   Bilirubin Total 0.3 0.0 - 1.2 mg/dL   Alkaline Phosphatase 106 44 - 121 IU/L   AST 13 0 - 40 IU/L   ALT 13 0 - 44 IU/L  TSH     Status: None   Collection Time: 07/13/24  2:46 PM  Result Value Ref Range   TSH 1.970 0.450 - 4.500 uIU/mL  Hemoglobin A1c     Status: Abnormal   Collection Time: 07/13/24  2:46 PM  Result Value Ref Range   Hgb A1c MFr Bld 5.9 (H) 4.8 - 5.6 %    Comment:          Prediabetes: 5.7 - 6.4          Diabetes: >6.4          Glycemic control for adults with diabetes: <7.0    Est. average glucose Bld gHb Est-mCnc 123 mg/dL  Vitamin B12     Status: None   Collection Time: 07/13/24  2:46 PM  Result Value Ref Range   Vitamin B-12 456 232 - 1,245 pg/mL  CBC with Diff     Status: Abnormal  Collection Time: 07/13/24  2:46 PM  Result Value Ref Range   WBC 13.7 (H) 3.4 - 10.8 x10E3/uL   RBC 5.38 4.14 - 5.80 x10E6/uL   Hemoglobin 14.6 13.0 - 17.7 g/dL   Hematocrit 53.8 62.4 - 51.0 %   MCV 86 79 - 97 fL   MCH 27.1 26.6 - 33.0 pg   MCHC 31.7 31.5 - 35.7 g/dL   RDW 86.6 88.3 - 84.5 %   Platelets 286 150 - 450 x10E3/uL   Neutrophils 56 Not Estab. %   Lymphs 32 Not Estab. %   Monocytes 7 Not Estab. %   Eos 4 Not Estab. %   Basos 1 Not Estab. %   Neutrophils Absolute 7.7 (H) 1.4 - 7.0 x10E3/uL   Lymphocytes Absolute 4.4 (H) 0.7 - 3.1 x10E3/uL   Monocytes Absolute 0.9 0.1 - 0.9 x10E3/uL   EOS (ABSOLUTE) 0.5 (H) 0.0 - 0.4 x10E3/uL   Basophils Absolute 0.2 0.0 - 0.2 x10E3/uL   Immature Granulocytes 0 Not Estab. %   Immature Grans (Abs) 0.0 0.0 - 0.1 x10E3/uL    Allergies as of 10/10/2024   No Known Allergies      Medication List        Accurate as of October 10, 2024 11:59 PM. If you have any questions, ask your nurse or doctor.           atorvastatin 10 MG tablet Commonly known as: LIPITOR TAKE ONE TABLET BY MOUTH ONCE A DAY   doxycycline  100 MG capsule Commonly known as: VIBRAMYCIN  Take 1 capsule (100 mg total) by mouth 2 (two) times daily. Started by: ALAN CHRISTELLA ARRANT   losartan  50 MG tablet Commonly known as: COZAAR  Take 1 tablet (50 mg total) by mouth daily.   VITAMIN D -3 PO Take 1 tablet by mouth once a week.   Vitamin D3 1.25 MG (50000 UT) Caps TAKE ONE CAPSULE BY MOUTH ONCE A WEEK            Assessment & Plan Chronic paronychia of finger, unspecified laterality Setting patient up for referral to ortho.  Will defer to them for further treatment changes.  Reassess at follow up.  Essential hypertension, benign Discussed starting the losartan , given blood pressure remains elevated.  Will reassess at follow up appt.     Return as previously scheduled unless not improving.  Total time spent: 20 minutes  ALAN CHRISTELLA ARRANT, FNP  10/10/2024   This document may have been prepared by [facility] Voice Recognition software and as such may include unintentional dictation errors.

## 2024-10-12 LAB — DES 1 AND DES 3 IGG
Desmoglein 1 IgG: <2 RU/mL (ref <20)
Desmoglein 3 IgG: <2 RU/mL (ref <20)

## 2024-10-12 NOTE — Addendum Note (Signed)
 Addended by: ORLEAN PALMA on: 10/12/2024 09:38 AM   Modules accepted: Orders

## 2024-10-12 NOTE — Assessment & Plan Note (Signed)
 Discussed starting the losartan , given blood pressure remains elevated.  Will reassess at follow up appt.

## 2024-10-13 ENCOUNTER — Ambulatory Visit: Payer: Self-pay

## 2024-11-14 NOTE — Progress Notes (Signed)
 William Cabrera                                          MRN: [id number]   11/14/2024   The [facility] Quality Team Specialist reviewed this patient medical record for the purposes of chart review for care gap closure. The following were reviewed: chart review for care gap closure-controlling blood pressure.    [facility] Quality Team

## 2024-11-18 ENCOUNTER — Ambulatory Visit: Admitting: Family

## 2024-12-28 ENCOUNTER — Other Ambulatory Visit: Payer: Self-pay | Admitting: Family

## 2025-01-10 ENCOUNTER — Ambulatory Visit: Payer: Self-pay | Admitting: Family

## 2025-01-10 ENCOUNTER — Encounter: Payer: Self-pay | Admitting: Family

## 2025-01-10 VITALS — BP 108/82 | Ht 67.0 in | Wt 267.4 lb

## 2025-01-10 DIAGNOSIS — G6289 Other specified polyneuropathies: Secondary | ICD-10-CM

## 2025-01-10 DIAGNOSIS — L03019 Cellulitis of unspecified finger: Secondary | ICD-10-CM

## 2025-01-10 DIAGNOSIS — R202 Paresthesia of skin: Secondary | ICD-10-CM

## 2025-01-10 MED ORDER — KETOCONAZOLE 200 MG PO TABS: 400.0000 mg | ORAL_TABLET | Freq: Every day | ORAL | 0 refills | Status: AC

## 2025-01-11 ENCOUNTER — Other Ambulatory Visit: Payer: Self-pay | Admitting: Family

## 2025-01-11 ENCOUNTER — Encounter: Payer: Self-pay | Admitting: Family

## 2025-01-11 DIAGNOSIS — L03019 Cellulitis of unspecified finger: Secondary | ICD-10-CM

## 2025-01-11 DIAGNOSIS — G6289 Other specified polyneuropathies: Secondary | ICD-10-CM

## 2025-04-10 ENCOUNTER — Ambulatory Visit: Admitting: Family
